# Patient Record
Sex: Female | Born: 1980 | Race: White | Hispanic: No | Marital: Married | Smoking: Never smoker
Health system: Southern US, Community
[De-identification: ages and names within clinical notes are randomized; demographics above are authoritative.]

## PROBLEM LIST (undated history)

## (undated) ENCOUNTER — Inpatient Hospital Stay (HOSPITAL_COMMUNITY): Payer: Self-pay

## (undated) DIAGNOSIS — F419 Anxiety disorder, unspecified: Secondary | ICD-10-CM

## (undated) DIAGNOSIS — N200 Calculus of kidney: Secondary | ICD-10-CM

## (undated) HISTORY — PX: FACIAL LACERATIONS REPAIR: SHX1571

## (undated) HISTORY — DX: Anxiety disorder, unspecified: F41.9

## (undated) HISTORY — PX: INGUINAL HERNIA REPAIR: SUR1180

---

## 2013-06-02 ENCOUNTER — Emergency Department (HOSPITAL_COMMUNITY)
Admission: EM | Admit: 2013-06-02 | Discharge: 2013-06-03 | Disposition: A | Discharge status: Home / Self Care | Payer: BC Managed Care – PPO | Attending: Emergency Medicine | Admitting: Emergency Medicine

## 2013-06-02 DIAGNOSIS — Z3202 Encounter for pregnancy test, result negative: Secondary | ICD-10-CM | POA: Insufficient documentation

## 2013-06-02 DIAGNOSIS — R300 Dysuria: Secondary | ICD-10-CM | POA: Insufficient documentation

## 2013-06-02 DIAGNOSIS — N200 Calculus of kidney: Secondary | ICD-10-CM | POA: Insufficient documentation

## 2013-06-02 HISTORY — DX: Calculus of kidney: N20.0

## 2013-06-02 NOTE — ED Notes (Signed)
MD at bedside. 

## 2013-06-02 NOTE — ED Notes (Signed)
Pt. Came in with complaint of right flank pain at 10/10. Pt. Has history of kidney stone a year ago. W/ N/V.

## 2013-06-03 ENCOUNTER — Encounter (HOSPITAL_COMMUNITY): Payer: Self-pay | Admitting: Radiology

## 2013-06-03 ENCOUNTER — Emergency Department (HOSPITAL_COMMUNITY): Payer: BC Managed Care – PPO

## 2013-06-03 LAB — POCT I-STAT, CHEM 8
BUN: 13 mg/dL (ref 6–23)
Calcium, Ion: 1.16 mmol/L (ref 1.12–1.23)
Creatinine, Ser: 0.9 mg/dL (ref 0.50–1.10)
Sodium: 143 mEq/L (ref 135–145)
TCO2: 23 mmol/L (ref 0–100)

## 2013-06-03 LAB — URINE MICROSCOPIC-ADD ON

## 2013-06-03 LAB — URINALYSIS, ROUTINE W REFLEX MICROSCOPIC
Glucose, UA: NEGATIVE mg/dL
Ketones, ur: NEGATIVE mg/dL
Protein, ur: NEGATIVE mg/dL

## 2013-06-03 LAB — CBC WITH DIFFERENTIAL/PLATELET
Basophils Relative: 1 % (ref 0–1)
Eosinophils Absolute: 0.1 10*3/uL (ref 0.0–0.7)
HCT: 36.8 % (ref 36.0–46.0)
Hemoglobin: 13 g/dL (ref 12.0–15.0)
MCH: 31.9 pg (ref 26.0–34.0)
MCHC: 35.3 g/dL (ref 30.0–36.0)
Monocytes Absolute: 0.6 10*3/uL (ref 0.1–1.0)
Monocytes Relative: 8 % (ref 3–12)

## 2013-06-03 MED ORDER — HYDROCODONE-ACETAMINOPHEN 5-325 MG PO TABS: 1.0000 | ORAL_TABLET | Freq: Four times a day (QID) | ORAL | Status: DC | PRN

## 2013-06-03 MED ORDER — ONDANSETRON HCL 4 MG PO TABS: 4.0000 mg | ORAL_TABLET | Freq: Four times a day (QID) | ORAL | Status: DC

## 2013-06-03 MED ORDER — SODIUM CHLORIDE 0.9 % IV BOLUS (SEPSIS)
1000.0000 mL | Freq: Once | INTRAVENOUS | Status: AC
  Administered 2013-06-03: 1000 mL via INTRAVENOUS

## 2013-06-03 MED ORDER — ONDANSETRON HCL 4 MG/2ML IJ SOLN
4.0000 mg | Freq: Once | INTRAMUSCULAR | Status: AC
  Administered 2013-06-03: 4 mg via INTRAVENOUS
  Filled 2013-06-03: qty 2

## 2013-06-03 MED ORDER — MORPHINE SULFATE 4 MG/ML IJ SOLN
4.0000 mg | Freq: Once | INTRAMUSCULAR | Status: AC
  Administered 2013-06-03: 4 mg via INTRAVENOUS
  Filled 2013-06-03: qty 1

## 2013-06-03 NOTE — ED Notes (Signed)
Notified RN, Hardie Lora, pt. I-stat Chem 8 results potassium 3.4.

## 2013-06-03 NOTE — ED Provider Notes (Signed)
CSN: [id number]     Arrival date & time 06/02/13  2335 History   First MD Initiated Contact with Patient 06/02/13 2340     Chief Complaint  Patient presents with  . Flank Pain   (Consider location/radiation/quality/duration/timing/severity/associated sxs/prior Treatment) HPI Comments: Pain is in right flank, sudden onset, sharp, intermittent.  +associated n/v, dysuria.  Hx of similar symptoms w/ kidney stone in past.  Pain radiates to RLQ.  Patient is a 32 y.o. female presenting with flank pain. The history is provided by the patient. No language interpreter was used.  Flank Pain This is a new problem. The current episode started less than 1 hour ago. The problem occurs constantly. The problem has not changed since onset.Associated symptoms include abdominal pain. Pertinent negatives include no chest pain, no headaches and no shortness of breath. Nothing aggravates the symptoms. Nothing relieves the symptoms. Treatments tried: hydorcodone, zofran. The treatment provided mild relief.    Past Medical History  Diagnosis Date  . Kidney stones    No past surgical history on file. No family history on file. History  Substance Use Topics  . Smoking status: Not on file  . Smokeless tobacco: Not on file  . Alcohol Use: Not on file   OB History   Grav Para Term Preterm Abortions TAB SAB Ect Mult Living                 Review of Systems  Constitutional: Negative for fever, chills, diaphoresis, activity change, appetite change and fatigue.  HENT: Negative for congestion, sore throat, facial swelling, rhinorrhea, neck pain and neck stiffness.   Eyes: Negative for photophobia and discharge.  Respiratory: Negative for cough, chest tightness and shortness of breath.   Cardiovascular: Negative for chest pain, palpitations and leg swelling.  Gastrointestinal: Positive for abdominal pain. Negative for nausea, vomiting and diarrhea.  Endocrine: Negative for polydipsia and polyuria.  Genitourinary:  Positive for dysuria and flank pain. Negative for frequency, difficulty urinating and pelvic pain.  Musculoskeletal: Negative for back pain and arthralgias.  Skin: Negative for color change and wound.  Allergic/Immunologic: Negative for immunocompromised state.  Neurological: Negative for facial asymmetry, weakness, numbness and headaches.  Hematological: Does not bruise/bleed easily.  Psychiatric/Behavioral: Negative for confusion and agitation.    Allergies  Bactrim  Home Medications   Current Outpatient Rx  Name  Route  Sig  Dispense  Refill  . HYDROcodone-acetaminophen (NORCO/VICODIN) 5-325 MG per tablet   Oral   Take 1 tablet by mouth every 6 (six) hours as needed for pain.         Marland Kitchen ondansetron (ZOFRAN) 4 MG tablet   Oral   Take 4 mg by mouth every 8 (eight) hours as needed for nausea.         Marland Kitchen HYDROcodone-acetaminophen (NORCO/VICODIN) 5-325 MG per tablet   Oral   Take 1 tablet by mouth every 6 (six) hours as needed for pain.   15 tablet   0   . ondansetron (ZOFRAN) 4 MG tablet   Oral   Take 1 tablet (4 mg total) by mouth every 6 (six) hours.   15 tablet   0    BP 101/57  Pulse 54  Temp(Src) 98 F (36.7 C) (Oral)  Resp 18  Ht 5\' 4"  (1.626 m)  Wt 135 lb (61.236 kg)  BMI 23.16 kg/m2  SpO2 96%  LMP 05/16/2013 Physical Exam  Constitutional: She is oriented to person, place, and time. She appears well-developed and well-nourished. No distress.  HENT:  Head: Normocephalic and atraumatic.  Mouth/Throat: No oropharyngeal exudate.  Eyes: Pupils are equal, round, and reactive to light.  Neck: Normal range of motion. Neck supple.  Cardiovascular: Normal rate, regular rhythm and normal heart sounds.  Exam reveals no gallop and no friction rub.   No murmur heard. Pulmonary/Chest: Effort normal and breath sounds normal. No respiratory distress. She has no wheezes. She has no rales.  Abdominal: Soft. Bowel sounds are normal. She exhibits no distension and no mass.  There is no tenderness. There is no rebound and no guarding.  R flank pain w/o CVA tenderness, mild RLQ tenderness  Musculoskeletal: Normal range of motion. She exhibits no edema and no tenderness.  Neurological: She is alert and oriented to person, place, and time.  Skin: Skin is warm and dry.  Psychiatric: She has a normal mood and affect.    ED Course  Procedures (including critical care time) Labs Review Labs Reviewed  CBC WITH DIFFERENTIAL - Abnormal; Notable for the following:    Lymphocytes Relative 47 (*)    All other components within normal limits  URINALYSIS, ROUTINE W REFLEX MICROSCOPIC - Abnormal; Notable for the following:    APPearance CLOUDY (*)    Hgb urine dipstick LARGE (*)    All other components within normal limits  POCT I-STAT, CHEM 8 - Abnormal; Notable for the following:    Potassium 3.4 (*)    Glucose, Bld 114 (*)    All other components within normal limits  PREGNANCY, URINE  URINE MICROSCOPIC-ADD ON   Imaging Review Ct Abdomen Pelvis Wo Contrast  06/03/2013   *RADIOLOGY REPORT*  Clinical Data: Right flank pain radiating to right lower quadrant since 2300, nausea, vomiting, history bilateral kidney stones  CT ABDOMEN AND PELVIS WITHOUT CONTRAST  Technique:  Multidetector CT imaging of the abdomen and pelvis was performed following the standard protocol without intravenous contrast. Sagittal and coronal MPR images reconstructed from axial data set.  Comparison: None  Findings: Dependent atelectasis at bilateral lung bases. Tiny bilateral nonobstructing renal calculi. No hydronephrosis, ureteral calcification or ureteral dilatation. Within limits of a nonenhanced exam no focal abnormalities of the liver, spleen, pancreas, kidneys, or adrenal glands otherwise identified.  Normal appendix. Unremarkable bladder, uterus and adnexae. Tiny umbilical hernia containing fat. Stomach and bowel loops unremarkable. No mass, adenopathy, free fluid, or inflammatory process.  Bones unremarkable.  IMPRESSION: Tiny bilateral nonobstructing renal calculi. No acute intra-abdominal or intrapelvic abnormalities identified.   Original Report Authenticated By: Ulyses Southward, M.D.    MDM   1. Nephrolithiasis    Pt is a 32 y.o. female with Pmhx as above who presents with sudden onset R flank pain w/ radiation to the RLQ beginning about 30 mins PTA, assoc nausean vom/x, dysuria.  Hx of similar symptoms w/ kidney stone in past.  On PE, pt appears uncomfortable, but is NAD.  UA has large blood, but urine not infected. CBC, BMP, unremarkable.  CT stone study shows tiny BL nonobstructive renal calculi.  Pt feeling improved after 1 dose of IV dilaudid and IVF.  I believe she has likely passed a stone given her urine and improvement of symptoms.  I doubt acute surgical cause of flank pain such as appendicitis or ovarian torsion. Will d/c home w/ Rx for norco & zofran.  Return precautions given for new or worsening symptoms including worsening pain, fever, inability to tolerate PO.   1. Nephrolithiasis         Shanna Cisco, MD 06/03/13  0647 

## 2014-04-04 LAB — OB RESULTS CONSOLE GBS: STREP GROUP B AG: POSITIVE

## 2014-04-05 LAB — OB RESULTS CONSOLE ABO/RH: RH Type: POSITIVE

## 2014-04-05 LAB — OB RESULTS CONSOLE RUBELLA ANTIBODY, IGM: RUBELLA: IMMUNE

## 2014-04-05 LAB — OB RESULTS CONSOLE GC/CHLAMYDIA
CHLAMYDIA, DNA PROBE: NEGATIVE
GC PROBE AMP, GENITAL: NEGATIVE

## 2014-04-05 LAB — OB RESULTS CONSOLE HEPATITIS B SURFACE ANTIGEN: Hepatitis B Surface Ag: NEGATIVE

## 2014-04-05 LAB — OB RESULTS CONSOLE HIV ANTIBODY (ROUTINE TESTING): HIV: NONREACTIVE

## 2014-04-05 LAB — OB RESULTS CONSOLE ANTIBODY SCREEN: ANTIBODY SCREEN: NEGATIVE

## 2014-04-05 LAB — OB RESULTS CONSOLE RPR: RPR: NONREACTIVE

## 2014-09-28 NOTE — L&D Delivery Note (Signed)
Delivery Note  SVD viable female Apgars 8,9 over 2nd degree ML lac.  Delivered through nuchal cord reduced over body due to tight cord..  Placenta delivered spontaneously intact with 3VC. Repair with 2-0 Chromic with good support and hemostasis noted and R/V exam confirms.  PH art was sent.  [place] cord blood was not done..  Mother and baby were doing well.  EBL 300cc  Candice Campavid Kaine Mcquillen, MD

## 2014-10-13 ENCOUNTER — Encounter (HOSPITAL_COMMUNITY): Payer: Self-pay | Admitting: *Deleted

## 2014-10-13 ENCOUNTER — Inpatient Hospital Stay (HOSPITAL_COMMUNITY)
Admission: AD | Admit: 2014-10-13 | Discharge: 2014-10-13 | Disposition: A | Discharge status: Home / Self Care | Payer: 59 | Source: Ambulatory Visit | Attending: Obstetrics and Gynecology | Admitting: Obstetrics and Gynecology

## 2014-10-13 DIAGNOSIS — Z3A35 35 weeks gestation of pregnancy: Secondary | ICD-10-CM | POA: Diagnosis not present

## 2014-10-13 DIAGNOSIS — N898 Other specified noninflammatory disorders of vagina: Secondary | ICD-10-CM | POA: Diagnosis present

## 2014-10-13 DIAGNOSIS — O26893 Other specified pregnancy related conditions, third trimester: Secondary | ICD-10-CM | POA: Insufficient documentation

## 2014-10-13 LAB — URINALYSIS, ROUTINE W REFLEX MICROSCOPIC
BILIRUBIN URINE: NEGATIVE
Glucose, UA: NEGATIVE mg/dL
KETONES UR: NEGATIVE mg/dL
NITRITE: NEGATIVE
PH: 6 (ref 5.0–8.0)
Protein, ur: NEGATIVE mg/dL
Specific Gravity, Urine: 1.025 (ref 1.005–1.030)
UROBILINOGEN UA: 0.2 mg/dL (ref 0.0–1.0)

## 2014-10-13 LAB — AMNISURE RUPTURE OF MEMBRANE (ROM) NOT AT ARMC: Amnisure ROM: NEGATIVE

## 2014-10-13 LAB — URINE MICROSCOPIC-ADD ON

## 2014-10-13 LAB — POCT FERN TEST: POCT Fern Test: NEGATIVE

## 2014-10-13 NOTE — MAU Provider Note (Signed)
Subjective:  Ms. Samuel BoucheKacy Alberto is a 34 y.o. female G2P1002 at 926w6d who presents with complaints of leaking clear, vaginal discharge since this morning. She has continued to leak fluid throughout the morning. This has been an uncomplicated pregnancy and she denies history of preterm delivery.   She denies vaginal bleeding + fetal movements  Objective:  GENERAL: Well-developed, well-nourished female in no acute distress.  HEENT: Normocephalic, atraumatic.   SKIN: Warm, dry and without erythema PSYCH: Normal mood and affect  Filed Vitals:   10/13/14 1002 10/13/14 1017  BP:  111/63  Pulse:  75  Temp:  98.4 F (36.9 C)  TempSrc:  Oral  Resp:  18  Height: 5\' 4"  (1.626 m)   Weight: 68.947 kg (152 lb)    Results for orders placed or performed during the hospital encounter of 10/13/14 (from the past 48 hour(s))  Urinalysis, Routine w reflex microscopic     Status: Abnormal   Collection Time: 10/13/14  9:50 AM  Result Value Ref Range   Color, Urine YELLOW YELLOW   APPearance CLEAR CLEAR   Specific Gravity, Urine 1.025 1.005 - 1.030   pH 6.0 5.0 - 8.0   Glucose, UA NEGATIVE NEGATIVE mg/dL   Hgb urine dipstick TRACE (A) NEGATIVE   Bilirubin Urine NEGATIVE NEGATIVE   Ketones, ur NEGATIVE NEGATIVE mg/dL   Protein, ur NEGATIVE NEGATIVE mg/dL   Urobilinogen, UA 0.2 0.0 - 1.0 mg/dL   Nitrite NEGATIVE NEGATIVE   Leukocytes, UA TRACE (A) NEGATIVE  Urine microscopic-add on     Status: Abnormal   Collection Time: 10/13/14  9:50 AM  Result Value Ref Range   Squamous Epithelial / LPF FEW (A) RARE   WBC, UA 3-6 <3 WBC/hpf   RBC / HPF 3-6 <3 RBC/hpf   Bacteria, UA MANY (A) RARE  Amnisure rupture of membrane (rom)     Status: None   Collection Time: 10/13/14 10:40 AM  Result Value Ref Range   Amnisure ROM NEGATIVE   Fern Test     Status: Normal   Collection Time: 10/13/14 11:26 AM  Result Value Ref Range   POCT Fern Test Negative = intact amniotic membranes     Speculum exam: Vagina  - Small amount of creamy discharge, no odor, no pooling of fluid in the vaginal canal  Cervix - No contact bleeding, large amount of thick mucus adhered to cervix Bimanual exam: Dilation: 1 Effacement (%): Thick Exam by:: J Huxley Shurley NP  Crist FatFern collected, amnisure collected  Chaperone present for exam.   MDM Fern slide negative  Amnisure  Urine culture pending; reviewed UA after the patient left the hospital. Urine culture placed   Assessment:  Vaginal discharge in pregnancy; likely leukorrhea   Plan:  RN to call and notify Dr. Adela PortsAdkins    Safia Panzer Irene Sariyah Corcino, NP 10/13/2014 10:50 AM

## 2014-10-13 NOTE — Discharge Instructions (Signed)

## 2014-10-13 NOTE — Progress Notes (Signed)
Dr Renaldo FiddlerAdkins notified of pt's complaints, VE, FHR pattern, fern and amnisure results, orders received to discharge

## 2014-10-13 NOTE — MAU Note (Signed)
Pt states at 0745 had small amount of fluid/discharge. Unsure if water broke. Still having small amount of discharge. No large gush of fluid.

## 2014-10-14 LAB — URINE CULTURE
Colony Count: 10000
Special Requests: NORMAL

## 2014-11-02 ENCOUNTER — Telehealth (HOSPITAL_COMMUNITY): Payer: Self-pay | Admitting: *Deleted

## 2014-11-02 ENCOUNTER — Encounter (HOSPITAL_COMMUNITY): Payer: Self-pay | Admitting: *Deleted

## 2014-11-02 LAB — OB RESULTS CONSOLE GBS: STREP GROUP B AG: POSITIVE

## 2014-11-02 NOTE — Telephone Encounter (Signed)
Preadmission screen  

## 2014-11-05 ENCOUNTER — Encounter (HOSPITAL_COMMUNITY): Payer: Self-pay

## 2014-11-05 ENCOUNTER — Inpatient Hospital Stay (HOSPITAL_COMMUNITY)
Admission: RE | Admit: 2014-11-05 | Discharge: 2014-11-07 | DRG: 774 | Disposition: A | Discharge status: Home / Self Care | Payer: 59 | Source: Ambulatory Visit | Attending: Obstetrics and Gynecology | Admitting: Obstetrics and Gynecology

## 2014-11-05 DIAGNOSIS — O09523 Supervision of elderly multigravida, third trimester: Secondary | ICD-10-CM | POA: Diagnosis not present

## 2014-11-05 DIAGNOSIS — O9089 Other complications of the puerperium, not elsewhere classified: Secondary | ICD-10-CM | POA: Diagnosis present

## 2014-11-05 DIAGNOSIS — Z3490 Encounter for supervision of normal pregnancy, unspecified, unspecified trimester: Secondary | ICD-10-CM

## 2014-11-05 DIAGNOSIS — R3919 Other difficulties with micturition: Secondary | ICD-10-CM | POA: Diagnosis present

## 2014-11-05 DIAGNOSIS — O99824 Streptococcus B carrier state complicating childbirth: Secondary | ICD-10-CM | POA: Diagnosis present

## 2014-11-05 DIAGNOSIS — Z3A39 39 weeks gestation of pregnancy: Secondary | ICD-10-CM | POA: Diagnosis present

## 2014-11-05 DIAGNOSIS — Z3483 Encounter for supervision of other normal pregnancy, third trimester: Secondary | ICD-10-CM | POA: Diagnosis present

## 2014-11-05 LAB — CBC
HEMATOCRIT: 32.4 % — AB (ref 36.0–46.0)
HEMOGLOBIN: 11.1 g/dL — AB (ref 12.0–15.0)
MCH: 31.1 pg (ref 26.0–34.0)
MCHC: 34.3 g/dL (ref 30.0–36.0)
MCV: 90.8 fL (ref 78.0–100.0)
Platelets: 186 10*3/uL (ref 150–400)
RBC: 3.57 MIL/uL — ABNORMAL LOW (ref 3.87–5.11)
RDW: 14.3 % (ref 11.5–15.5)
WBC: 7.1 10*3/uL (ref 4.0–10.5)

## 2014-11-05 MED ORDER — LIDOCAINE HCL (PF) 1 % IJ SOLN
30.0000 mL | INTRAMUSCULAR | Status: DC | PRN
  Filled 2014-11-05: qty 30

## 2014-11-05 MED ORDER — ONDANSETRON HCL 4 MG/2ML IJ SOLN: 4.0000 mg | Freq: Four times a day (QID) | INTRAMUSCULAR | Status: DC | PRN

## 2014-11-05 MED ORDER — MISOPROSTOL 25 MCG QUARTER TABLET
25.0000 ug | ORAL_TABLET | ORAL | Status: DC | PRN
  Administered 2014-11-05: 25 ug via VAGINAL
  Filled 2014-11-05: qty 0.25
  Filled 2014-11-05: qty 1

## 2014-11-05 MED ORDER — PENICILLIN G POTASSIUM 5000000 UNITS IJ SOLR
2.5000 10*6.[IU] | INTRAVENOUS | Status: DC
  Filled 2014-11-05 (×2): qty 2.5

## 2014-11-05 MED ORDER — ACETAMINOPHEN 325 MG PO TABS: 650.0000 mg | ORAL_TABLET | ORAL | Status: DC | PRN

## 2014-11-05 MED ORDER — OXYTOCIN 40 UNITS IN LACTATED RINGERS INFUSION - SIMPLE MED
1.0000 m[IU]/min | INTRAVENOUS | Status: DC
  Administered 2014-11-06: 2 m[IU]/min via INTRAVENOUS
  Filled 2014-11-05: qty 1000

## 2014-11-05 MED ORDER — LACTATED RINGERS IV SOLN
INTRAVENOUS | Status: DC
  Administered 2014-11-05 – 2014-11-06 (×5): via INTRAVENOUS

## 2014-11-05 MED ORDER — LACTATED RINGERS IV SOLN: 500.0000 mL | INTRAVENOUS | Status: DC | PRN

## 2014-11-05 MED ORDER — OXYCODONE-ACETAMINOPHEN 5-325 MG PO TABS
2.0000 | ORAL_TABLET | ORAL | Status: DC | PRN
Start: 2014-11-05 — End: 2014-11-06

## 2014-11-05 MED ORDER — ZOLPIDEM TARTRATE 5 MG PO TABS
5.0000 mg | ORAL_TABLET | Freq: Every evening | ORAL | Status: DC | PRN
  Administered 2014-11-05: 5 mg via ORAL
  Filled 2014-11-05: qty 1

## 2014-11-05 MED ORDER — OXYTOCIN BOLUS FROM INFUSION: 500.0000 mL | INTRAVENOUS | Status: DC

## 2014-11-05 MED ORDER — TERBUTALINE SULFATE 1 MG/ML IJ SOLN: 0.2500 mg | Freq: Once | INTRAMUSCULAR | Status: AC | PRN

## 2014-11-05 MED ORDER — CITRIC ACID-SODIUM CITRATE 334-500 MG/5ML PO SOLN: 30.0000 mL | ORAL | Status: DC | PRN

## 2014-11-05 MED ORDER — OXYCODONE-ACETAMINOPHEN 5-325 MG PO TABS: 1.0000 | ORAL_TABLET | ORAL | Status: DC | PRN

## 2014-11-05 MED ORDER — PENICILLIN G POTASSIUM 5000000 UNITS IJ SOLR
5.0000 10*6.[IU] | Freq: Once | INTRAVENOUS | Status: DC
  Filled 2014-11-05: qty 5

## 2014-11-05 MED ORDER — OXYTOCIN 40 UNITS IN LACTATED RINGERS INFUSION - SIMPLE MED: 62.5000 mL/h | INTRAVENOUS | Status: DC

## 2014-11-06 ENCOUNTER — Encounter (HOSPITAL_COMMUNITY): Payer: Self-pay

## 2014-11-06 ENCOUNTER — Inpatient Hospital Stay (HOSPITAL_COMMUNITY): Payer: 59 | Admitting: Anesthesiology

## 2014-11-06 LAB — TYPE AND SCREEN
ABO/RH(D): B POS
Antibody Screen: NEGATIVE

## 2014-11-06 LAB — ABO/RH: ABO/RH(D): B POS

## 2014-11-06 MED ORDER — ZOLPIDEM TARTRATE 5 MG PO TABS
5.0000 mg | ORAL_TABLET | Freq: Every evening | ORAL | Status: DC | PRN
Start: 2014-11-06 — End: 2014-11-08

## 2014-11-06 MED ORDER — OXYCODONE-ACETAMINOPHEN 5-325 MG PO TABS: 1.0000 | ORAL_TABLET | ORAL | Status: DC | PRN

## 2014-11-06 MED ORDER — PHENYLEPHRINE 40 MCG/ML (10ML) SYRINGE FOR IV PUSH (FOR BLOOD PRESSURE SUPPORT)
80.0000 ug | PREFILLED_SYRINGE | INTRAVENOUS | Status: AC | PRN
  Administered 2014-11-06 (×3): 80 ug via INTRAVENOUS
  Filled 2014-11-06: qty 20

## 2014-11-06 MED ORDER — PRENATAL MULTIVITAMIN CH
1.0000 | ORAL_TABLET | Freq: Every day | ORAL | Status: DC
  Filled 2014-11-06: qty 1

## 2014-11-06 MED ORDER — IBUPROFEN 600 MG PO TABS
600.0000 mg | ORAL_TABLET | Freq: Four times a day (QID) | ORAL | Status: DC
  Administered 2014-11-06 – 2014-11-07 (×4): 600 mg via ORAL
  Filled 2014-11-06 (×4): qty 1

## 2014-11-06 MED ORDER — MEDROXYPROGESTERONE ACETATE 150 MG/ML IM SUSP: 150.0000 mg | INTRAMUSCULAR | Status: DC | PRN

## 2014-11-06 MED ORDER — ONDANSETRON HCL 4 MG/2ML IJ SOLN: 4.0000 mg | INTRAMUSCULAR | Status: DC | PRN

## 2014-11-06 MED ORDER — EPHEDRINE 5 MG/ML INJ
10.0000 mg | INTRAVENOUS | Status: DC | PRN
  Administered 2014-11-06: 10 mg via INTRAVENOUS
  Filled 2014-11-06: qty 2

## 2014-11-06 MED ORDER — TETANUS-DIPHTH-ACELL PERTUSSIS 5-2.5-18.5 LF-MCG/0.5 IM SUSP: 0.5000 mL | Freq: Once | INTRAMUSCULAR | Status: DC

## 2014-11-06 MED ORDER — BUTORPHANOL TARTRATE 1 MG/ML IJ SOLN
1.0000 mg | INTRAMUSCULAR | Status: DC | PRN
  Administered 2014-11-06: 1 mg via INTRAVENOUS
  Filled 2014-11-06: qty 1

## 2014-11-06 MED ORDER — LANOLIN HYDROUS EX OINT: TOPICAL_OINTMENT | CUTANEOUS | Status: DC | PRN

## 2014-11-06 MED ORDER — WITCH HAZEL-GLYCERIN EX PADS: 1.0000 "application " | MEDICATED_PAD | CUTANEOUS | Status: DC | PRN

## 2014-11-06 MED ORDER — SIMETHICONE 80 MG PO CHEW: 80.0000 mg | CHEWABLE_TABLET | ORAL | Status: DC | PRN

## 2014-11-06 MED ORDER — SODIUM CHLORIDE 0.9 % IV SOLN
2.0000 g | Freq: Four times a day (QID) | INTRAVENOUS | Status: DC
  Administered 2014-11-06 (×2): 2 g via INTRAVENOUS
  Filled 2014-11-06 (×4): qty 2000

## 2014-11-06 MED ORDER — DIPHENHYDRAMINE HCL 25 MG PO CAPS: 25.0000 mg | ORAL_CAPSULE | Freq: Four times a day (QID) | ORAL | Status: DC | PRN

## 2014-11-06 MED ORDER — LIDOCAINE HCL (PF) 1 % IJ SOLN
INTRAMUSCULAR | Status: DC | PRN
  Administered 2014-11-06 (×2): 5 mL

## 2014-11-06 MED ORDER — OXYCODONE-ACETAMINOPHEN 5-325 MG PO TABS: 2.0000 | ORAL_TABLET | ORAL | Status: DC | PRN

## 2014-11-06 MED ORDER — DIPHENHYDRAMINE HCL 50 MG/ML IJ SOLN: 12.5000 mg | INTRAMUSCULAR | Status: DC | PRN

## 2014-11-06 MED ORDER — BENZOCAINE-MENTHOL 20-0.5 % EX AERO
1.0000 "application " | INHALATION_SPRAY | CUTANEOUS | Status: DC | PRN
  Filled 2014-11-06: qty 56

## 2014-11-06 MED ORDER — SENNOSIDES-DOCUSATE SODIUM 8.6-50 MG PO TABS
2.0000 | ORAL_TABLET | ORAL | Status: DC
  Administered 2014-11-06: 2 via ORAL
  Filled 2014-11-06: qty 2

## 2014-11-06 MED ORDER — LACTATED RINGERS IV SOLN: 500.0000 mL | Freq: Once | INTRAVENOUS | Status: DC

## 2014-11-06 MED ORDER — PHENYLEPHRINE 40 MCG/ML (10ML) SYRINGE FOR IV PUSH (FOR BLOOD PRESSURE SUPPORT)
80.0000 ug | PREFILLED_SYRINGE | INTRAVENOUS | Status: DC | PRN
  Filled 2014-11-06: qty 2

## 2014-11-06 MED ORDER — FENTANYL 2.5 MCG/ML BUPIVACAINE 1/10 % EPIDURAL INFUSION (WH - ANES)
14.0000 mL/h | INTRAMUSCULAR | Status: DC | PRN
  Administered 2014-11-06: 14 mL/h via EPIDURAL
  Filled 2014-11-06: qty 125

## 2014-11-06 MED ORDER — MEASLES, MUMPS & RUBELLA VAC ~~LOC~~ INJ: 0.5000 mL | INJECTION | Freq: Once | SUBCUTANEOUS | Status: DC

## 2014-11-06 MED ORDER — EPHEDRINE 5 MG/ML INJ
10.0000 mg | INTRAVENOUS | Status: DC | PRN
  Filled 2014-11-06: qty 4
  Filled 2014-11-06: qty 2

## 2014-11-06 MED ORDER — DIBUCAINE 1 % RE OINT: 1.0000 "application " | TOPICAL_OINTMENT | RECTAL | Status: DC | PRN

## 2014-11-06 MED ORDER — ONDANSETRON HCL 4 MG PO TABS: 4.0000 mg | ORAL_TABLET | ORAL | Status: DC | PRN

## 2014-11-06 NOTE — Progress Notes (Addendum)
Plan for IV pain relief. Pt is contracting q 2-3 minutes, cytotec was placed 4.5 hours ago. Per Dr Renaldo FiddlerAdkins will reevaluate in the AM to see if another dose of cytotec is needed or initiation of pitocin if ctx decrease.

## 2014-11-06 NOTE — Anesthesia Preprocedure Evaluation (Signed)
Anesthesia Evaluation  Patient identified by MRN, date of birth, ID band Patient awake    Reviewed: Allergy & Precautions, H&P , Patient's Chart, lab work & pertinent test results  Airway Mallampati: II TM Distance: >3 FB Neck ROM: full    Dental   Pulmonary  breath sounds clear to auscultation        Cardiovascular Rhythm:regular Rate:Normal     Neuro/Psych PSYCHIATRIC DISORDERS    GI/Hepatic   Endo/Other    Renal/GU      Musculoskeletal   Abdominal   Peds  Hematology   Anesthesia Other Findings   Reproductive/Obstetrics (+) Pregnancy                           Anesthesia Physical Anesthesia Plan  ASA: II  Anesthesia Plan: Epidural   Post-op Pain Management:    Induction:   Airway Management Planned:   Additional Equipment:   Intra-op Plan:   Post-operative Plan:   Informed Consent: I have reviewed the patients History and Physical, chart, labs and discussed the procedure including the risks, benefits and alternatives for the proposed anesthesia with the patient or authorized representative who has indicated his/her understanding and acceptance.     Plan Discussed with:   Anesthesia Plan Comments:         Anesthesia Quick Evaluation  

## 2014-11-06 NOTE — H&P (Signed)
Pamela Gamble is a 34 y.o. female presenting for induction of labor.  Pregnancy complicated by AMA with normal NIPT and GBS+. History OB History    Gravida Para Term Preterm AB TAB SAB Ectopic Multiple Living   2 1 1  0 0 0 0 0 0 1     Past Medical History  Diagnosis Date  . Kidney stones   . Anxiety    Past Surgical History  Procedure Laterality Date  . Inguinal hernia repair    . Facial lacerations repair     Family History: family history includes Cancer in her father and paternal uncle; Hypertension in her mother and paternal grandmother. Social History:  reports that she has never smoked. She has never used smokeless tobacco. She reports that she does not drink alcohol or use illicit drugs.   Prenatal Transfer Tool  Maternal Diabetes: No Genetic Screening: Normal Maternal Ultrasounds/Referrals: Normal Fetal Ultrasounds or other Referrals:  None Maternal Substance Abuse:  No Significant Maternal Medications:  None Significant Maternal Lab Results:  None Other Comments:  None  ROS  Dilation: 2 Effacement (%): 50 Station: -2 Exam by:: Sharmin Foulk Blood pressure 105/55, pulse 77, temperature 97.9 F (36.6 C), temperature source Oral, resp. rate 20, height 5\' 4"  (1.626 m), weight 154 lb (69.854 kg), SpO2 100 %. Exam Physical Exam  Prenatal labs: ABO, Rh: --/--/B POS, B POS (02/08 2030) Antibody: NEG (02/08 2030) Rubella: Immune (07/09 0000) RPR: Nonreactive (07/09 0000)  HBsAg: Negative (07/09 0000)  HIV: Non-reactive (07/09 0000)  GBS: Positive (02/05 0000)   Assessment/Plan: IUP at term GBS + antibiotics throughout labor AROM/Pitocin  Anticipate SVD  Rashana Andrew C 11/06/2014, 9:32 AM

## 2014-11-06 NOTE — Plan of Care (Signed)
Spoke with Dr. Malen GauzeFoster informed him pt had received 3rd dose of phenylephrine. States to give pt ephedrine 10mg  ivp.

## 2014-11-06 NOTE — Anesthesia Procedure Notes (Signed)
Epidural Patient location during procedure: OB Start time: 11/06/2014 8:20 AM  Staffing Anesthesiologist: Brayton CavesJACKSON, Chrishaun Sasso Performed by: anesthesiologist   Preanesthetic Checklist Completed: patient identified, site marked, surgical consent, pre-op evaluation, timeout performed, IV checked, risks and benefits discussed and monitors and equipment checked  Epidural Patient position: sitting Prep: site prepped and draped and DuraPrep Patient monitoring: continuous pulse ox and blood pressure Approach: midline Location: L3-L4 Injection technique: LOR air  Needle:  Needle type: Tuohy  Needle gauge: 17 G Needle length: 9 cm and 9 Needle insertion depth: 5 cm cm Catheter type: closed end flexible Catheter size: 19 Gauge Catheter at skin depth: 10 cm Test dose: negative  Assessment Events: blood not aspirated, injection not painful, no injection resistance, negative IV test and no paresthesia  Additional Notes Patient identified.  Risk benefits discussed including failed block, incomplete pain control, headache, nerve damage, paralysis, blood pressure changes, nausea, vomiting, reactions to medication both toxic or allergic, and postpartum back pain.  Patient expressed understanding and wished to proceed.  All questions were answered.  Sterile technique used throughout procedure and epidural site dressed with sterile barrier dressing. No paresthesia or other complications noted.The patient did not experience any signs of intravascular injection such as tinnitus or metallic taste in mouth nor signs of intrathecal spread such as rapid motor block. Please see nursing notes for vital signs.

## 2014-11-07 LAB — CBC
HCT: 31.5 % — ABNORMAL LOW (ref 36.0–46.0)
Hemoglobin: 10.8 g/dL — ABNORMAL LOW (ref 12.0–15.0)
MCH: 31.4 pg (ref 26.0–34.0)
MCHC: 34.3 g/dL (ref 30.0–36.0)
MCV: 91.6 fL (ref 78.0–100.0)
PLATELETS: 177 10*3/uL (ref 150–400)
RBC: 3.44 MIL/uL — ABNORMAL LOW (ref 3.87–5.11)
RDW: 14.7 % (ref 11.5–15.5)
WBC: 8.8 10*3/uL (ref 4.0–10.5)

## 2014-11-07 LAB — HIV ANTIBODY (ROUTINE TESTING W REFLEX): HIV SCREEN 4TH GENERATION: NONREACTIVE

## 2014-11-07 LAB — RPR: RPR: NONREACTIVE

## 2014-11-07 MED ORDER — IBUPROFEN 600 MG PO TABS: 600.0000 mg | ORAL_TABLET | Freq: Four times a day (QID) | ORAL | Status: DC | PRN

## 2014-11-07 MED ORDER — BENZOCAINE-MENTHOL 20-0.5 % EX AERO: 1.0000 "application " | INHALATION_SPRAY | Freq: Three times a day (TID) | CUTANEOUS | Status: DC | PRN

## 2014-11-07 MED ORDER — COMPLETENATE 29-1 MG PO CHEW
1.0000 | CHEWABLE_TABLET | Freq: Every day | ORAL | Status: DC
  Administered 2014-11-07: 1 via ORAL
  Filled 2014-11-07 (×2): qty 1

## 2014-11-07 MED ORDER — OXYCODONE-ACETAMINOPHEN 5-325 MG PO TABS: 1.0000 | ORAL_TABLET | Freq: Four times a day (QID) | ORAL | Status: DC | PRN

## 2014-11-07 NOTE — Progress Notes (Signed)
Post void now about 60cc Feels ready to go home  VSS Afeb  D/C home Instructions given, double void

## 2014-11-07 NOTE — Progress Notes (Signed)
Post Partum Day 1 Subjective: up ad lib, tolerating PO and complains of urine leakage , unsure if bladder emptying well.  Objective: Blood pressure 99/58, pulse 76, temperature 97.6 F (36.4 C), temperature source Oral, resp. rate 18, height 5\' 4"  (1.626 m), weight 154 lb (69.854 kg), SpO2 98 %, unknown if currently breastfeeding.  Physical Exam:  General: alert and cooperative Lochia: appropriate Uterine Fundus: firm Incision: healing well DVT Evaluation: No evidence of DVT seen on physical exam. Negative Homan's sign. No cords or calf tenderness. No significant calf/ankle edema.   Recent Labs  11/05/14 2030 11/07/14 0540  HGB 11.1* 10.8*  HCT 32.4* 31.5*    Assessment/Plan: Plan for discharge tomorrow   ultrasound of bladder for residual urine assessment   LOS: 2 days   Pamela Gamble 11/07/2014, 8:11 AM

## 2014-11-07 NOTE — Lactation Note (Signed)
This note was copied from the chart of Girl Samuel BoucheKacy Kennard. Lactation Consultation Note  Patient Name: Girl Samuel BoucheKacy Lecomte [id number]'s Date: 11/07/2014 Reason for consult: Initial assessment  Baby 21 hours of life. Mom nursed first baby for 3.5 months and used a NS throughout that time because she had nipple pain whenever she tried to nurse without shield. Mom states that she just finished nursing on left breast using the NS and baby did fine. However, mom does has a compression stripe on right nipple from nursing earlier without the shield. Examined baby's mouth, and showed mom how the tip of baby's tongue is heart-shaped when baby extends tongue just past the gumline. Also demonstrated how baby not able to lift tongue to palate. Enc mom to discuss with baby's pediatrician if nipple pain continues, especially after milk comes in and she is attempting to latch without the shield. Discussed need for additional stimulation to breast since baby not directly on nipple. Mom return-demonstrated hand expression with colostrum visible. Mom states that she has seen colostrum in baby's mouth. Enc mom to offer lots of STS and nurse with cues.  [facility] given [facility], aware of OP/BFSG, community resources, and [facility] phone [facility] assistance after D/C. Answered mom's questions about pumping/nursing and returning to work.   Maternal Data Has patient been taught Hand Expression?: Yes Does the patient have breastfeeding experience prior to this delivery?: Yes  Feeding Feeding Type: Breast Fed Length of feed: 30 min  LATCH Score/Interventions Latch: Grasps breast easily, tongue down, lips flanged, rhythmical sucking.  Audible Swallowing: A few with stimulation  Type of Nipple: Everted at rest and after stimulation Intervention(s): Shells  Comfort (Breast/Nipple): Filling, red/small blisters or bruises, mild/mod discomfort  Problem noted: Mild/Moderate discomfort  Hold (Positioning): No assistance needed to correctly  position infant at breast. Intervention(s): Skin to skin  LATCH Score: 8  Lactation Tools Discussed/Used     Consult Status Consult Status: Follow-up Date: 11/08/14 Follow-up type: In-patient    Geralynn OchsWILLIARD, Cressie Betzler 11/07/2014, 2:03 PM

## 2014-11-07 NOTE — Discharge Summary (Signed)
Obstetric Discharge Summary Reason for Admission: onset of labor Prenatal Procedures: ultrasound Intrapartum Procedures: spontaneous vaginal delivery Postpartum Procedures: none Complications-Operative and Postpartum: none HEMOGLOBIN  Date Value Ref Range Status  11/07/2014 10.8* 12.0 - 15.0 g/dL Final   HCT  Date Value Ref Range Status  11/07/2014 31.5* 36.0 - 46.0 % Final    Physical Exam:  General: alert, cooperative and no distress Lochia: appropriate Uterine Fundus: firm Incision: healing well DVT Evaluation: No evidence of DVT seen on physical exam.  Discharge Diagnoses: Term Pregnancy-delivered  Discharge Information: Date: 11/07/2014 Activity: pelvic rest Diet: routine Medications: PNV, Ibuprofen and Percocet Condition: stable Instructions: refer to practice specific booklet Discharge to: home   Newborn Data: Live born female  Birth Weight: 7 lb 0.9 oz (3200 g) APGAR: 8, 9  Home with mother.  Cressida Milford II,Natlie Asfour E 11/07/2014, 5:28 PM

## 2014-11-07 NOTE — Anesthesia Postprocedure Evaluation (Signed)
  Anesthesia Post-op Note  Patient: Pamela BoucheKacy [place]  Procedure(s) Performed: * No procedures listed *  Patient Location: Mother/Baby  Anesthesia Type:Epidural  Level of Consciousness: awake, alert , oriented and patient cooperative  Airway and Oxygen Therapy: Patient Spontanous Breathing  Post-op Pain: mild  Post-op Assessment: Patient's Cardiovascular Status Stable, Respiratory Function Stable, No headache, No backache, No residual numbness and No residual motor weakness  Post-op Vital Signs: stable  Last Vitals:  Filed Vitals:   11/07/14 0800  BP: 107/57  Pulse: 70  Temp: 36.8 C  Resp: 18    Complications: No apparent anesthesia complications

## 2014-11-08 NOTE — Progress Notes (Signed)
CSW received consult for history of generalized anxiety.   CSW unable to meet with the MOB prior to discharge as she was an early discharge. CSW was not notified of any acute concerns.

## 2015-09-29 NOTE — L&D Delivery Note (Signed)
Delivery Note At 6:01 PM a viable female was delivered via OA Presentation Apgars 9 9 weight pending Placenta status: spontaneously with 3 vessel cord, .  Cord:  with the following complications:none .  Cord pH: not obtained  Anesthesia:  epidural Episiotomy:  none Lacerations:  first Suture Repair: 3.0 chromic Est. Blood Loss (mL):  300  Mom to postpartum.  Baby to Couplet care / Skin to Skin.  Ranyah Groeneveld L 06/21/2016, 6:12 PM

## 2015-11-15 ENCOUNTER — Inpatient Hospital Stay (HOSPITAL_COMMUNITY): Payer: 59

## 2015-11-15 ENCOUNTER — Encounter (HOSPITAL_COMMUNITY): Payer: Self-pay | Admitting: *Deleted

## 2015-11-15 ENCOUNTER — Inpatient Hospital Stay (HOSPITAL_COMMUNITY)
Admission: AD | Admit: 2015-11-15 | Discharge: 2015-11-15 | Disposition: A | Discharge status: Home / Self Care | Payer: 59 | Source: Ambulatory Visit | Attending: Obstetrics and Gynecology | Admitting: Obstetrics and Gynecology

## 2015-11-15 DIAGNOSIS — Z87442 Personal history of urinary calculi: Secondary | ICD-10-CM | POA: Diagnosis not present

## 2015-11-15 DIAGNOSIS — Z3A08 8 weeks gestation of pregnancy: Secondary | ICD-10-CM | POA: Diagnosis not present

## 2015-11-15 DIAGNOSIS — J189 Pneumonia, unspecified organism: Secondary | ICD-10-CM | POA: Diagnosis not present

## 2015-11-15 DIAGNOSIS — R0602 Shortness of breath: Secondary | ICD-10-CM

## 2015-11-15 DIAGNOSIS — O99511 Diseases of the respiratory system complicating pregnancy, first trimester: Secondary | ICD-10-CM | POA: Diagnosis not present

## 2015-11-15 DIAGNOSIS — J101 Influenza due to other identified influenza virus with other respiratory manifestations: Secondary | ICD-10-CM | POA: Diagnosis present

## 2015-11-15 DIAGNOSIS — O9989 Other specified diseases and conditions complicating pregnancy, childbirth and the puerperium: Secondary | ICD-10-CM | POA: Insufficient documentation

## 2015-11-15 DIAGNOSIS — R079 Chest pain, unspecified: Secondary | ICD-10-CM | POA: Diagnosis not present

## 2015-11-15 DIAGNOSIS — R05 Cough: Secondary | ICD-10-CM | POA: Diagnosis not present

## 2015-11-15 DIAGNOSIS — Z881 Allergy status to other antibiotic agents status: Secondary | ICD-10-CM | POA: Insufficient documentation

## 2015-11-15 LAB — CBC
HCT: 37 % (ref 36.0–46.0)
Hemoglobin: 12.8 g/dL (ref 12.0–15.0)
MCH: 31 pg (ref 26.0–34.0)
MCHC: 34.6 g/dL (ref 30.0–36.0)
MCV: 89.6 fL (ref 78.0–100.0)
PLATELETS: 202 10*3/uL (ref 150–400)
RBC: 4.13 MIL/uL (ref 3.87–5.11)
RDW: 13.9 % (ref 11.5–15.5)
WBC: 5.5 10*3/uL (ref 4.0–10.5)

## 2015-11-15 LAB — URINALYSIS, ROUTINE W REFLEX MICROSCOPIC
BILIRUBIN URINE: NEGATIVE
GLUCOSE, UA: NEGATIVE mg/dL
Hgb urine dipstick: NEGATIVE
Ketones, ur: NEGATIVE mg/dL
Leukocytes, UA: NEGATIVE
Nitrite: NEGATIVE
Protein, ur: NEGATIVE mg/dL
SPECIFIC GRAVITY, URINE: 1.02 (ref 1.005–1.030)
pH: 6 (ref 5.0–8.0)

## 2015-11-15 LAB — POCT PREGNANCY, URINE: Preg Test, Ur: POSITIVE — AB

## 2015-11-15 MED ORDER — CEFDINIR 300 MG PO CAPS: 300.0000 mg | ORAL_CAPSULE | Freq: Two times a day (BID) | ORAL | Status: AC

## 2015-11-15 MED ORDER — AZITHROMYCIN 250 MG PO TABS: ORAL_TABLET | ORAL | Status: AC

## 2015-11-15 MED ORDER — SODIUM CHLORIDE 0.9 % IV BOLUS (SEPSIS)
1000.0000 mL | Freq: Once | INTRAVENOUS | Status: AC
  Administered 2015-11-15: 1000 mL via INTRAVENOUS

## 2015-11-15 MED ORDER — DEXTROSE 5 % IV SOLN
2.0000 g | Freq: Once | INTRAVENOUS | Status: AC
  Administered 2015-11-15: 2 g via INTRAVENOUS
  Filled 2015-11-15: qty 2

## 2015-11-15 MED ORDER — LACTATED RINGERS IV BOLUS (SEPSIS)
1000.0000 mL | Freq: Once | INTRAVENOUS | Status: DC
  Administered 2015-11-15: 1000 mL via INTRAVENOUS

## 2015-11-15 NOTE — Discharge Instructions (Signed)

## 2015-11-15 NOTE — MAU Note (Signed)
Sick with the flu since Monday night, has been running fever, had some vomiting, went to urgent care on Wednesday tested positive for Influenza A, cough is worse, yellow sputum, shoulder pain, head pain, dizziness.

## 2015-11-15 NOTE — MAU Provider Note (Signed)
History     CSN: [id number]  Arrival date and time: 11/15/15 1401   None     Chief Complaint  Patient presents with  . Influenza   HPI Pamela Gamble 35 y.o. [id number] @ 8w2  presents to MAU after tsing positive for Flu type A 4 days ago, her symptoms worsened today including feeling winded, fatigue and overall malaise. She has been taking tamiflu.   Past Medical History  Diagnosis Date  . Kidney stones   . Anxiety     Past Surgical History  Procedure Laterality Date  . Inguinal hernia repair    . Facial lacerations repair      Family History  Problem Relation Age of Onset  . Hypertension Mother   . Cancer Father   . Cancer Paternal Uncle   . Hypertension Paternal Grandmother     Social History  Substance Use Topics  . Smoking status: Never Smoker   . Smokeless tobacco: Never Used  . Alcohol Use: No    Allergies:  Allergies  Allergen Reactions  . Bactrim [Sulfamethoxazole-Trimethoprim] Rash    Prescriptions prior to admission  Medication Sig Dispense Refill Last Dose  . acetaminophen (TYLENOL) 500 MG tablet Take 1,000 mg by mouth every 6 (six) hours as needed for moderate pain, fever or headache.   11/15/2015 at Unknown time  . guaiFENesin-dextromethorphan (ROBITUSSIN DM) 100-10 MG/5ML syrup Take 5 mLs by mouth every 4 (four) hours as needed for cough.   11/14/2015 at Unknown time  . loratadine (CLARITIN) 10 MG tablet Take 10 mg by mouth daily.   11/15/2015 at Unknown time  . oseltamivir (TAMIFLU) 75 MG capsule Take 75 mg by mouth 2 (two) times daily.   11/15/2015 at Unknown time  . Prenatal Vit-Fe Fumarate-FA (PRENATAL MULTIVITAMIN) TABS tablet Take 1 tablet by mouth daily at 12 noon.   11/15/2015 at Unknown time  . valACYclovir (VALTREX) 1000 MG tablet Take 2,000 mg by mouth 2 (two) times daily.   11/14/2015 at Unknown time  . benzocaine-Menthol (DERMOPLAST) 20-0.5 % AERO Apply 1 application topically 3 (three) times daily as needed (perineal discomfort). (Patient  not taking: Reported on 11/15/2015) 56 g 2 Not Taking at Unknown time  . ibuprofen (ADVIL,MOTRIN) 600 MG tablet Take 1 tablet (600 mg total) by mouth every 6 (six) hours as needed. (Patient not taking: Reported on 11/15/2015) 30 tablet 0 Not Taking at Unknown time  . oxyCODONE-acetaminophen (PERCOCET/ROXICET) 5-325 MG per tablet Take 1-2 tablets by mouth every 6 (six) hours as needed (for pain scale less than 7). (Patient not taking: Reported on 11/15/2015) 30 tablet 0 Not Taking at Unknown time    Review of Systems  Constitutional: Positive for malaise/fatigue. Negative for fever.  Respiratory: Positive for cough, sputum production and shortness of breath.   All other systems reviewed and are negative.  Physical Exam   Blood pressure 126/93, pulse 82, temperature 97.9 F (36.6 C), temperature source Oral, resp. rate 20, height  (1.626 m), weight 66.044 kg (145 lb 9.6 oz), last menstrual period 09/18/2015, SpO2 100 %, unknown if currently breastfeeding.  Physical Exam  Nursing note and vitals reviewed. Constitutional: She is oriented to person, place, and time. She appears well-developed and well-nourished. No distress.  HENT:  Head: Normocephalic and atraumatic.  Neck: Normal range of motion.  Respiratory: Effort normal and breath sounds normal. No respiratory distress. She has no wheezes. She has no rales. She exhibits no tenderness.  GI: Soft. She exhibits no distension. There is no  tenderness.  Musculoskeletal: Normal range of motion.  Neurological: She is alert and oriented to person, place, and time.  Skin: Skin is warm and dry.  Psychiatric: She has a normal mood and affect. Her behavior is normal. Judgment and thought content normal.  Dg Chest 2 View  11/15/2015  CLINICAL DATA:  Left-sided chest pain with cough and congestion for 5 days EXAM: CHEST  2 VIEW COMPARISON:  None. FINDINGS: On the lateral view, there is an ill-defined focus of increased opacity in the inferior  anterior mediastinum. The lungs elsewhere are clear. Heart size and pulmonary vascularity are normal. No adenopathy. No bone lesions. IMPRESSION: Suspect small focus of infiltrate anteriorly, seen only on the lateral view slightly anterior to the heart. Lungs elsewhere clear. Cardiac silhouette within normal limits. Electronically Signed   By: Bretta Bang III M.D.   On: 11/15/2015 16:02   Results for orders placed or performed during the hospital encounter of 11/15/15 (from the past 24 hour(s))  Urinalysis, Routine w reflex microscopic (not at [facility])     Status: None   Collection Time: 11/15/15  2:15 PM  Result Value Ref Range   Color, Urine YELLOW YELLOW   APPearance CLEAR CLEAR   Specific Gravity, Urine 1.020 1.005 - 1.030   pH 6.0 5.0 - 8.0   Glucose, UA NEGATIVE NEGATIVE mg/dL   Hgb urine dipstick NEGATIVE NEGATIVE   Bilirubin Urine NEGATIVE NEGATIVE   Ketones, ur NEGATIVE NEGATIVE mg/dL   Protein, ur NEGATIVE NEGATIVE mg/dL   Nitrite NEGATIVE NEGATIVE   Leukocytes, UA NEGATIVE NEGATIVE  CBC     Status: None   Collection Time: 11/15/15  4:15 PM  Result Value Ref Range   WBC 5.5 4.0 - 10.5 K/uL   RBC 4.13 3.87 - 5.11 MIL/uL   Hemoglobin 12.8 12.0 - 15.0 g/dL   HCT 40.9 81.1 - 91.4 %   MCV 89.6 78.0 - 100.0 fL   MCH 31.0 26.0 - 34.0 pg   MCHC 34.6 30.0 - 36.0 g/dL   RDW 78.2 95.6 - 21.3 %   Platelets 202 150 - 400 K/uL    MAU Course  Procedures  MDM  Pneumonia and Influenza; IV antibiotics and POC discussed with Dr. Rana Snare. Dr Rana Snare on his way to see pt and discuss admission or treatment on outpatient basis. Assessment and Plan  Pneumonia Influenza Type A Z Pac Omnicef  PO BID x 7 d Discharge    Clemmons,Lori Grissett 11/15/2015, 5:11 PM

## 2015-11-18 LAB — OB RESULTS CONSOLE HIV ANTIBODY (ROUTINE TESTING): HIV: NONREACTIVE

## 2015-11-18 LAB — OB RESULTS CONSOLE RUBELLA ANTIBODY, IGM: RUBELLA: IMMUNE

## 2015-11-18 LAB — OB RESULTS CONSOLE RPR: RPR: NONREACTIVE

## 2015-11-18 LAB — OB RESULTS CONSOLE HEPATITIS B SURFACE ANTIGEN: Hepatitis B Surface Ag: NEGATIVE

## 2015-11-18 LAB — OB RESULTS CONSOLE GC/CHLAMYDIA
Chlamydia: NEGATIVE
GC PROBE AMP, GENITAL: NEGATIVE

## 2015-11-18 LAB — OB RESULTS CONSOLE ABO/RH: RH Type: POSITIVE

## 2015-11-18 LAB — OB RESULTS CONSOLE ANTIBODY SCREEN: Antibody Screen: NEGATIVE

## 2016-05-19 ENCOUNTER — Inpatient Hospital Stay (HOSPITAL_COMMUNITY)
Admission: AD | Admit: 2016-05-19 | Discharge: 2016-05-19 | Disposition: A | Discharge status: Home / Self Care | Payer: 59 | Source: Ambulatory Visit | Attending: Obstetrics and Gynecology | Admitting: Obstetrics and Gynecology

## 2016-05-19 ENCOUNTER — Encounter (HOSPITAL_COMMUNITY): Payer: Self-pay

## 2016-05-19 DIAGNOSIS — Z3A34 34 weeks gestation of pregnancy: Secondary | ICD-10-CM | POA: Insufficient documentation

## 2016-05-19 DIAGNOSIS — Z8249 Family history of ischemic heart disease and other diseases of the circulatory system: Secondary | ICD-10-CM | POA: Diagnosis not present

## 2016-05-19 DIAGNOSIS — Z882 Allergy status to sulfonamides status: Secondary | ICD-10-CM | POA: Insufficient documentation

## 2016-05-19 DIAGNOSIS — O4703 False labor before 37 completed weeks of gestation, third trimester: Secondary | ICD-10-CM | POA: Diagnosis not present

## 2016-05-19 LAB — URINALYSIS, ROUTINE W REFLEX MICROSCOPIC
BILIRUBIN URINE: NEGATIVE
Glucose, UA: NEGATIVE mg/dL
KETONES UR: NEGATIVE mg/dL
Leukocytes, UA: NEGATIVE
Nitrite: NEGATIVE
Protein, ur: NEGATIVE mg/dL
Specific Gravity, Urine: 1.01 (ref 1.005–1.030)
pH: 6 (ref 5.0–8.0)

## 2016-05-19 LAB — URINE MICROSCOPIC-ADD ON

## 2016-05-19 MED ORDER — LACTATED RINGERS IV BOLUS (SEPSIS)
1000.0000 mL | Freq: Once | INTRAVENOUS | Status: AC
  Administered 2016-05-19: 1000 mL via INTRAVENOUS

## 2016-05-19 MED ORDER — NIFEDIPINE 10 MG PO CAPS
10.0000 mg | ORAL_CAPSULE | ORAL | Status: AC
  Administered 2016-05-19 (×2): 10 mg via ORAL
  Filled 2016-05-19 (×2): qty 1

## 2016-05-19 MED ORDER — NIFEDIPINE 10 MG PO CAPS
20.0000 mg | ORAL_CAPSULE | Freq: Once | ORAL | Status: AC
  Administered 2016-05-19: 20 mg via ORAL
  Filled 2016-05-19: qty 2

## 2016-05-19 NOTE — Discharge Instructions (Signed)

## 2016-05-19 NOTE — MAU Note (Signed)
Patient presents with c/o of ctx since 0100. Patient says she saw pink when she wiped. Denies LOF. Fetus active.

## 2016-05-19 NOTE — MAU Provider Note (Signed)
History     CSN: [id number]  Arrival date and time: 05/19/16 [id number]   First Provider Initiated Contact with Patient 05/19/16 [phone]      Chief Complaint  Patient presents with  . Contractions   Pamela Gamble is a 35 y.o. G3P2002 at 10357w6d who presents today with contractions. She states that the contractions began around 0100, and are about every 5 mins. She rates her pain 6/10 at this time. She denies any LOF. She did have some spotting when she wiped upon arrival. She confirms normal fetal movement. She denies any complicatiosn with this pregnancy or her other two pregnancies. She states that her first two were born at full. Her next appointment is 05/26/16.   Pelvic Pain  The patient's primary symptoms include pelvic pain. This is a new problem. The current episode started today. The problem occurs intermittently. The problem has been unchanged. Pain severity now: 6/10  The problem affects both sides. She is pregnant. Associated symptoms include abdominal pain and dysuria ("I had pressure when I was urinating earlier today, but it is better now."). Pertinent negatives include no chills, constipation, diarrhea, fever, frequency, nausea, urgency or vomiting. The vaginal discharge was normal. There has been no bleeding. Nothing aggravates the symptoms. She has tried nothing for the symptoms.   Past Medical History:  Diagnosis Date  . Anxiety   . Kidney stones     Past Surgical History:  Procedure Laterality Date  . FACIAL LACERATIONS REPAIR    . INGUINAL HERNIA REPAIR      Family History  Problem Relation Age of Onset  . Hypertension Mother   . Cancer Father   . Cancer Paternal Uncle   . Hypertension Paternal Grandmother     Social History  Substance Use Topics  . Smoking status: Never Smoker  . Smokeless tobacco: Never Used  . Alcohol use No    Allergies:  Allergies  Allergen Reactions  . Bactrim [Sulfamethoxazole-Trimethoprim] Rash    Prescriptions Prior to Admission   Medication Sig Dispense Refill Last Dose  . acetaminophen (TYLENOL) 500 MG tablet Take 1,000 mg by mouth every 6 (six) hours as needed for moderate pain, fever or headache.   11/15/2015 at Unknown time  . guaiFENesin-dextromethorphan (ROBITUSSIN DM) 100-10 MG/5ML syrup Take 5 mLs by mouth every 4 (four) hours as needed for cough.   11/14/2015 at Unknown time  . loratadine (CLARITIN) 10 MG tablet Take 10 mg by mouth daily.   11/15/2015 at Unknown time  . oseltamivir (TAMIFLU) 75 MG capsule Take 75 mg by mouth 2 (two) times daily.   11/15/2015 at Unknown time  . Prenatal Vit-Fe Fumarate-FA (PRENATAL MULTIVITAMIN) TABS tablet Take 1 tablet by mouth daily at 12 noon.   11/15/2015 at Unknown time  . valACYclovir (VALTREX) 1000 MG tablet Take 2,000 mg by mouth 2 (two) times daily.   11/14/2015 at Unknown time    Review of Systems  Constitutional: Negative for chills and fever.  Gastrointestinal: Positive for abdominal pain. Negative for constipation, diarrhea, nausea and vomiting.  Genitourinary: Positive for dysuria ("I had pressure when I was urinating earlier today, but it is better now.") and pelvic pain. Negative for frequency and urgency.   Physical Exam   Blood pressure 119/64, pulse 77, temperature 97.9 F (36.6 C), temperature source Oral, resp. rate 17, last menstrual period 09/18/2015, unknown if currently breastfeeding.  Physical Exam  Nursing note and vitals reviewed. Constitutional: She is oriented to person, place, and time. She appears well-developed and well-nourished.  No distress.  HENT:  Head: Normocephalic.  Cardiovascular: Normal rate.   Respiratory: Effort normal.  GI: Soft. There is no tenderness.  Genitourinary:  Genitourinary Comments: Cervix: 1/thick/-3  Neurological: She is alert and oriented to person, place, and time.  Skin: Skin is warm and dry.  Psychiatric: She has a normal mood and affect.   FHT: 125, moderate with 15x15 accels, no decels Toco: q 3-5 min  contractions  MAU Course  Procedures  MDM [id number]: D/W Dr. Henderson Cloudomblin, will give procardia and IV fluids. If contractions stop then she can be DC home. If not then call him back.   0502: Patient has had 2 contractions is 40 mins, and reports that they are not as strong as before either.   Assessment and Plan   1. Preterm contractions, third trimester   2. [redacted] weeks gestation of pregnancy    DC home Comfort measures reviewed  3rd Trimester precautions  PTL precautions  Fetal kick counts RX: none  Return to MAU as needed FU with OB as planned  Follow-up Information    LOWE,DAVID C, MD .   Specialty:  Obstetrics and Gynecology Contact information: [address], [address] [place] [address] [phone]            Tawnya CrookHogan, Heather Donovan 05/19/2016, 3:27 AM

## 2016-05-20 LAB — URINE CULTURE: CULTURE: NO GROWTH

## 2016-05-26 LAB — OB RESULTS CONSOLE GBS: GBS: POSITIVE

## 2016-06-21 ENCOUNTER — Inpatient Hospital Stay (HOSPITAL_COMMUNITY)
Admission: AD | Admit: 2016-06-21 | Discharge: 2016-06-22 | DRG: 775 | Disposition: A | Discharge status: Home / Self Care | Payer: 59 | Source: Ambulatory Visit | Attending: Obstetrics and Gynecology | Admitting: Obstetrics and Gynecology

## 2016-06-21 ENCOUNTER — Inpatient Hospital Stay (HOSPITAL_COMMUNITY): Payer: 59 | Admitting: Anesthesiology

## 2016-06-21 ENCOUNTER — Encounter (HOSPITAL_COMMUNITY): Payer: Self-pay

## 2016-06-21 DIAGNOSIS — Z3A39 39 weeks gestation of pregnancy: Secondary | ICD-10-CM | POA: Diagnosis not present

## 2016-06-21 DIAGNOSIS — Z3483 Encounter for supervision of other normal pregnancy, third trimester: Secondary | ICD-10-CM | POA: Diagnosis present

## 2016-06-21 DIAGNOSIS — Z8249 Family history of ischemic heart disease and other diseases of the circulatory system: Secondary | ICD-10-CM | POA: Diagnosis not present

## 2016-06-21 DIAGNOSIS — IMO0001 Reserved for inherently not codable concepts without codable children: Secondary | ICD-10-CM

## 2016-06-21 LAB — TYPE AND SCREEN
ABO/RH(D): B POS
Antibody Screen: NEGATIVE

## 2016-06-21 LAB — CBC
HCT: 32.9 % — ABNORMAL LOW (ref 36.0–46.0)
Hemoglobin: 11.3 g/dL — ABNORMAL LOW (ref 12.0–15.0)
MCH: 29.7 pg (ref 26.0–34.0)
MCHC: 34.3 g/dL (ref 30.0–36.0)
MCV: 86.4 fL (ref 78.0–100.0)
Platelets: 211 K/uL (ref 150–400)
RBC: 3.81 MIL/uL — ABNORMAL LOW (ref 3.87–5.11)
RDW: 15.8 % — ABNORMAL HIGH (ref 11.5–15.5)
WBC: 9 K/uL (ref 4.0–10.5)

## 2016-06-21 LAB — RPR: RPR Ser Ql: NONREACTIVE

## 2016-06-21 MED ORDER — PHENYLEPHRINE 40 MCG/ML (10ML) SYRINGE FOR IV PUSH (FOR BLOOD PRESSURE SUPPORT)
80.0000 ug | PREFILLED_SYRINGE | INTRAVENOUS | Status: DC | PRN
  Administered 2016-06-21: 80 ug via INTRAVENOUS
  Filled 2016-06-21: qty 10
  Filled 2016-06-21: qty 5

## 2016-06-21 MED ORDER — MEASLES, MUMPS & RUBELLA VAC ~~LOC~~ INJ
0.5000 mL | INJECTION | Freq: Once | SUBCUTANEOUS | Status: DC
  Filled 2016-06-21: qty 0.5

## 2016-06-21 MED ORDER — ONDANSETRON HCL 4 MG/2ML IJ SOLN
4.0000 mg | Freq: Four times a day (QID) | INTRAMUSCULAR | Status: DC | PRN
  Administered 2016-06-21: 4 mg via INTRAVENOUS
  Filled 2016-06-21: qty 2

## 2016-06-21 MED ORDER — FENTANYL 2.5 MCG/ML BUPIVACAINE 1/10 % EPIDURAL INFUSION (WH - ANES)
14.0000 mL/h | INTRAMUSCULAR | Status: DC | PRN
  Administered 2016-06-21 (×2): 14 mL/h via EPIDURAL
  Filled 2016-06-21: qty 125

## 2016-06-21 MED ORDER — LIDOCAINE HCL (PF) 1 % IJ SOLN
30.0000 mL | INTRAMUSCULAR | Status: DC | PRN
  Filled 2016-06-21: qty 30

## 2016-06-21 MED ORDER — OXYTOCIN 40 UNITS IN LACTATED RINGERS INFUSION - SIMPLE MED
2.5000 [IU]/h | INTRAVENOUS | Status: DC
Start: 2016-06-21 — End: 2016-06-21

## 2016-06-21 MED ORDER — ACETAMINOPHEN 325 MG PO TABS
650.0000 mg | ORAL_TABLET | ORAL | Status: DC | PRN
  Administered 2016-06-22: 650 mg via ORAL
  Filled 2016-06-21: qty 2

## 2016-06-21 MED ORDER — PHENYLEPHRINE 40 MCG/ML (10ML) SYRINGE FOR IV PUSH (FOR BLOOD PRESSURE SUPPORT)
80.0000 ug | PREFILLED_SYRINGE | INTRAVENOUS | Status: DC | PRN
  Filled 2016-06-21: qty 10
  Filled 2016-06-21: qty 5

## 2016-06-21 MED ORDER — EPHEDRINE 5 MG/ML INJ
10.0000 mg | INTRAVENOUS | Status: DC | PRN
Start: 2016-06-21 — End: 2016-06-21
  Filled 2016-06-21: qty 4

## 2016-06-21 MED ORDER — LACTATED RINGERS IV SOLN
500.0000 mL | Freq: Once | INTRAVENOUS | Status: AC
  Administered 2016-06-21: 500 mL via INTRAVENOUS

## 2016-06-21 MED ORDER — OXYTOCIN BOLUS FROM INFUSION: 500.0000 mL | Freq: Once | INTRAVENOUS | Status: DC

## 2016-06-21 MED ORDER — WITCH HAZEL-GLYCERIN EX PADS: 1.0000 "application " | MEDICATED_PAD | CUTANEOUS | Status: DC | PRN

## 2016-06-21 MED ORDER — COCONUT OIL OIL
1.0000 | TOPICAL_OIL | Status: DC | PRN
Start: 2016-06-21 — End: 2016-06-22

## 2016-06-21 MED ORDER — DIPHENHYDRAMINE HCL 25 MG PO CAPS: 25.0000 mg | ORAL_CAPSULE | Freq: Four times a day (QID) | ORAL | Status: DC | PRN

## 2016-06-21 MED ORDER — OXYCODONE-ACETAMINOPHEN 5-325 MG PO TABS: 2.0000 | ORAL_TABLET | ORAL | Status: DC | PRN

## 2016-06-21 MED ORDER — DIBUCAINE 1 % RE OINT: 1.0000 "application " | TOPICAL_OINTMENT | RECTAL | Status: DC | PRN

## 2016-06-21 MED ORDER — TETANUS-DIPHTH-ACELL PERTUSSIS 5-2.5-18.5 LF-MCG/0.5 IM SUSP: 0.5000 mL | Freq: Once | INTRAMUSCULAR | Status: DC

## 2016-06-21 MED ORDER — SOD CITRATE-CITRIC ACID 500-334 MG/5ML PO SOLN: 30.0000 mL | ORAL | Status: DC | PRN

## 2016-06-21 MED ORDER — IBUPROFEN 600 MG PO TABS
600.0000 mg | ORAL_TABLET | Freq: Four times a day (QID) | ORAL | Status: DC
  Administered 2016-06-21 – 2016-06-22 (×4): 600 mg via ORAL
  Filled 2016-06-21 (×4): qty 1

## 2016-06-21 MED ORDER — PENICILLIN G POTASSIUM 5000000 UNITS IJ SOLR
2.5000 10*6.[IU] | INTRAVENOUS | Status: DC
  Administered 2016-06-21: 2.5 10*6.[IU] via INTRAVENOUS
  Filled 2016-06-21 (×4): qty 2.5

## 2016-06-21 MED ORDER — LACTATED RINGERS IV SOLN
500.0000 mL | INTRAVENOUS | Status: DC | PRN
  Administered 2016-06-21 (×2): 500 mL via INTRAVENOUS

## 2016-06-21 MED ORDER — SENNOSIDES-DOCUSATE SODIUM 8.6-50 MG PO TABS
2.0000 | ORAL_TABLET | ORAL | Status: DC
Start: 2016-06-22 — End: 2016-06-22
  Administered 2016-06-22: 2 via ORAL
  Filled 2016-06-21: qty 2

## 2016-06-21 MED ORDER — BISACODYL 10 MG RE SUPP: 10.0000 mg | Freq: Every day | RECTAL | Status: DC | PRN

## 2016-06-21 MED ORDER — OXYCODONE-ACETAMINOPHEN 5-325 MG PO TABS: 1.0000 | ORAL_TABLET | ORAL | Status: DC | PRN

## 2016-06-21 MED ORDER — SIMETHICONE 80 MG PO CHEW: 80.0000 mg | CHEWABLE_TABLET | ORAL | Status: DC | PRN

## 2016-06-21 MED ORDER — PRENATAL MULTIVITAMIN CH
1.0000 | ORAL_TABLET | Freq: Every day | ORAL | Status: DC
  Administered 2016-06-22: 1 via ORAL
  Filled 2016-06-21: qty 1

## 2016-06-21 MED ORDER — OXYTOCIN 40 UNITS IN LACTATED RINGERS INFUSION - SIMPLE MED
1.0000 m[IU]/min | INTRAVENOUS | Status: DC
  Administered 2016-06-21: 2 m[IU]/min via INTRAVENOUS
  Filled 2016-06-21: qty 1000

## 2016-06-21 MED ORDER — LACTATED RINGERS IV SOLN
INTRAVENOUS | Status: DC
  Administered 2016-06-21 (×3): via INTRAVENOUS

## 2016-06-21 MED ORDER — DIPHENHYDRAMINE HCL 50 MG/ML IJ SOLN: 12.5000 mg | INTRAMUSCULAR | Status: DC | PRN

## 2016-06-21 MED ORDER — ONDANSETRON HCL 4 MG PO TABS: 4.0000 mg | ORAL_TABLET | ORAL | Status: DC | PRN

## 2016-06-21 MED ORDER — PENICILLIN G POTASSIUM 5000000 UNITS IJ SOLR
5.0000 10*6.[IU] | Freq: Once | INTRAMUSCULAR | Status: AC
  Administered 2016-06-21: 5 10*6.[IU] via INTRAVENOUS
  Filled 2016-06-21: qty 5

## 2016-06-21 MED ORDER — ONDANSETRON HCL 4 MG/2ML IJ SOLN: 4.0000 mg | INTRAMUSCULAR | Status: DC | PRN

## 2016-06-21 MED ORDER — TERBUTALINE SULFATE 1 MG/ML IJ SOLN
0.2500 mg | Freq: Once | INTRAMUSCULAR | Status: DC | PRN
  Filled 2016-06-21: qty 1

## 2016-06-21 MED ORDER — BENZOCAINE-MENTHOL 20-0.5 % EX AERO
1.0000 "application " | INHALATION_SPRAY | CUTANEOUS | Status: DC | PRN
  Administered 2016-06-21: 1 via TOPICAL
  Filled 2016-06-21: qty 56

## 2016-06-21 MED ORDER — ACETAMINOPHEN 325 MG PO TABS: 650.0000 mg | ORAL_TABLET | ORAL | Status: DC | PRN

## 2016-06-21 MED ORDER — LIDOCAINE HCL (PF) 1 % IJ SOLN
INTRAMUSCULAR | Status: DC | PRN
  Administered 2016-06-21 (×2): 4 mL via EPIDURAL

## 2016-06-21 MED ORDER — MEDROXYPROGESTERONE ACETATE 150 MG/ML IM SUSP: 150.0000 mg | INTRAMUSCULAR | Status: DC | PRN

## 2016-06-21 MED ORDER — FLEET ENEMA 7-19 GM/118ML RE ENEM: 1.0000 | ENEMA | Freq: Every day | RECTAL | Status: DC | PRN

## 2016-06-21 MED ORDER — ZOLPIDEM TARTRATE 5 MG PO TABS: 5.0000 mg | ORAL_TABLET | Freq: Every evening | ORAL | Status: DC | PRN

## 2016-06-21 MED ORDER — EPHEDRINE 5 MG/ML INJ
10.0000 mg | INTRAVENOUS | Status: DC | PRN
  Filled 2016-06-21: qty 4

## 2016-06-21 NOTE — Anesthesia Preprocedure Evaluation (Addendum)
Anesthesia Evaluation  Patient identified by MRN, date of birth, ID band Patient awake    Reviewed: Allergy & Precautions, H&P , Patient's Chart, lab work & pertinent test results  Airway Mallampati: II  TM Distance: >3 FB Neck ROM: full    Dental  (+) Teeth Intact   Pulmonary    breath sounds clear to auscultation       Cardiovascular  Rhythm:regular Rate:Normal     Neuro/Psych PSYCHIATRIC DISORDERS Anxiety    GI/Hepatic   Endo/Other    Renal/GU      Musculoskeletal   Abdominal   Peds  Hematology   Anesthesia Other Findings Pregnancy - uncomplicated Platelets and allergies reviewed Denies active cardiac or pulmonary symptoms, METS > 4  Denies blood thinning medications, bleeding disorders, hypertension, asthma, supine hypotension syndrome, previous anesthesia difficulties   Reproductive/Obstetrics (+) Pregnancy                            Anesthesia Physical  Anesthesia Plan  ASA: II  Anesthesia Plan: Epidural   Post-op Pain Management:    Induction:   Airway Management Planned:   Additional Equipment:   Intra-op Plan:   Post-operative Plan:   Informed Consent: I have reviewed the patients History and Physical, chart, labs and discussed the procedure including the risks, benefits and alternatives for the proposed anesthesia with the patient or authorized representative who has indicated his/her understanding and acceptance.     Plan Discussed with:   Anesthesia Plan Comments:         Anesthesia Quick Evaluation

## 2016-06-21 NOTE — H&P (Signed)
Pamela Gamble is a [age] year old G 3 P 1 at 3539 w 4 days presents in labor. OB History    Gravida Para Term Preterm AB Living   3 2 2  0 0 2   SAB TAB Ectopic Multiple Live Births   0 0 0 0 2     Past Medical History:  Diagnosis Date  . Anxiety   . Kidney stones    Past Surgical History:  Procedure Laterality Date  . FACIAL LACERATIONS REPAIR    . INGUINAL HERNIA REPAIR     Family History: family history includes Cancer in her father and paternal uncle; Hypertension in her mother and paternal grandmother. Social History:  reports that she has never smoked. She has never used smokeless tobacco. She reports that she does not drink alcohol or use drugs.     Maternal Diabetes: No Genetic Screening: Normal Maternal Ultrasounds/Referrals: Normal Fetal Ultrasounds or other Referrals:  None Maternal Substance Abuse:  No Significant Maternal Medications:  None Significant Maternal Lab Results:  None Other Comments:  None  Review of Systems  All other systems reviewed and are negative.  Maternal Medical History:  Reason for admission: Contractions.   Prenatal complications: no prenatal complications   Dilation: 3 Effacement (%): 50 Station: -2 Exam by:: Pamela Josephsheryl Anderson RN Blood pressure 123/70, pulse 97, temperature 97.7 F (36.5 C), temperature source Oral, resp. rate 18, last menstrual period 09/18/2015, unknown if currently breastfeeding. Maternal Exam:  Uterine Assessment: Contraction strength is moderate.  Abdomen: Fetal presentation: vertex     Physical Exam  Nursing note and vitals reviewed. Constitutional: She appears well-developed and well-nourished.  HENT:  Head: Normocephalic and atraumatic.  Eyes: Pupils are equal, round, and reactive to light.  Neck: Normal range of motion.  Cardiovascular: Normal rate.     Prenatal labs: ABO, Rh:   Antibody:   Rubella:   RPR:    HBsAg:    HIV:    GBS:     Assessment/Plan: IUP at 39 w 4 days Labor Admit AROM  after Admission GBBS+ - penicillin Pamela Gamble 06/21/2016, 8:33 AM

## 2016-06-21 NOTE — MAU Note (Signed)
Dr. Vincente PoliGrewal in to see patient, per MD patient will walk while awaiting room assignment.

## 2016-06-21 NOTE — Progress Notes (Signed)
Patient is waiting for epidural FHR is category 1 Toco irregular UCs Cervix is not changed AROM Clear fluid scant Epidural Start pitocin prn

## 2016-06-21 NOTE — Anesthesia Procedure Notes (Signed)
Epidural Patient location during procedure: OB  Staffing Anesthesiologist: Ina Poupard Performed: anesthesiologist   Preanesthetic Checklist Completed: patient identified, site marked, surgical consent, pre-op evaluation, timeout performed, IV checked, risks and benefits discussed and monitors and equipment checked  Epidural Patient position: sitting Prep: site prepped and draped and DuraPrep Patient monitoring: continuous pulse ox and blood pressure Approach: midline Location: L3-L4 Injection technique: LOR saline  Needle:  Needle type: Tuohy  Needle gauge: 17 G Needle length: 9 cm and 9 Needle insertion depth: 6 cm Catheter type: closed end flexible Catheter size: 19 Gauge Catheter at skin depth: 10 cm Test dose: negative  Assessment Events: blood not aspirated, injection not painful, no injection resistance, negative IV test and no paresthesia  Additional Notes Patient identified. Risks/Benefits/Options discussed with patient including but not limited to bleeding, infection, nerve damage, paralysis, failed block, incomplete pain control, headache, blood pressure changes, nausea, vomiting, reactions to medications, itching and postpartum back pain. Confirmed with bedside nurse the patient's most recent platelet count. Confirmed with patient that they are not currently taking any anticoagulation, have any bleeding history or any family history of bleeding disorders. Patient expressed understanding and wished to proceed. All questions were answered. Sterile technique was used throughout the entire procedure. Please see nursing notes for vital signs. Test dose was given through epidural catheter and negative prior to continuing to dose epidural or start infusion. Warning signs of high block given to the patient including shortness of breath, tingling/numbness in hands, complete motor block, or any concerning symptoms with instructions to call for help. Patient was given instructions  on fall risk and not to get out of bed. All questions and concerns addressed with instructions to call with any issues or inadequate analgesia.  Reason for block:at surgeon's request and procedure for pain

## 2016-06-21 NOTE — MAU Note (Signed)
Pt presents complaining of contractions since 0145 this am. Denies bleeding or leaking of fluid. Reports good fetal movement.

## 2016-06-22 LAB — CBC
HCT: 29.8 % — ABNORMAL LOW (ref 36.0–46.0)
Hemoglobin: 10.2 g/dL — ABNORMAL LOW (ref 12.0–15.0)
MCH: 29.6 pg (ref 26.0–34.0)
MCHC: 34.2 g/dL (ref 30.0–36.0)
MCV: 86.4 fL (ref 78.0–100.0)
PLATELETS: 187 10*3/uL (ref 150–400)
RBC: 3.45 MIL/uL — AB (ref 3.87–5.11)
RDW: 15.9 % — AB (ref 11.5–15.5)
WBC: 7.1 10*3/uL (ref 4.0–10.5)

## 2016-06-22 MED ORDER — INFLUENZA VAC SPLIT QUAD 0.5 ML IM SUSY
0.5000 mL | PREFILLED_SYRINGE | INTRAMUSCULAR | Status: AC
  Administered 2016-06-22: 0.5 mL via INTRAMUSCULAR

## 2016-06-22 NOTE — Clinical Social Work Maternal (Signed)
  CLINICAL SOCIAL WORK MATERNAL/CHILD NOTE  Patient Details  Name: Pamela Gamble MRN: [id number] Date of Birth: 1981/01/12  Date:  06/22/2016  Clinical Social Worker Initiating Note:  Laurey Arrow Date/ Time Initiated:  06/22/16/1440     Child's Name:  Pamela Gamble   Legal Guardian:  Mother   Need for Interpreter:  None   Date of Referral:  06/22/16     Reason for Referral:  Behavioral Health Issues, including SI    Referral Source:  Central Nursery   Address:  [place] [id number]  Phone number:  [phone]   Household Members:  Self, Minor Children, Spouse   Natural Supports (not living in the home):  Friends, Immediate Family, Extended Family, Spouse/significant other   Professional Supports: None   Employment: Full-time   Type of Work: MD at [employer]   Education:  Financial controller Resources:  Multimedia programmer   Other Resources:      Cultural/Religious Considerations Which May Impact Care:  None Reported  Strengths:  Ability to meet basic needs , Lexicographer chosen , Home prepared for child , Understanding of illness   Risk Factors/Current Problems:  Mental Health Concerns    Cognitive State:  Alert , Insightful    Mood/Affect:  Calm , Flat , Comfortable    CSW Assessment: CSW ,met with MOB to complete an assessment for hx of anxiety d/o.  MOB was inviting and polite.  MOB affect was flat, however, MOB was engaged.  MOB gave CSW permission to meet with MOB while MOB's husband (Pamela Gamble) was present.  CSW inquired about MOB's mental health and MOB reported a hx of anxiety since age 67.  MOB communicated an increase in anxiety symptoms during MOB's pregnancy.  MOB stated that MOB consulted with MOB's OBGYN office and MOB was prescribed a medication to assist MOB with sleeping at night.  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  CSW encouraged MOB to  reach to seek medical attention if needed for increased signs and symptoms for PPD. MOB declined all other resources offered by CSW.  MOB had not questions at this time.    CSW Plan/Description:  Patient/Family Education , No Further Intervention Required/No Barriers to Discharge   Laurey Arrow, MSW, LCSW Clinical Social Work [phone]   Dimple Nanas, LCSW 06/22/2016, 2:51 PM

## 2016-06-22 NOTE — Anesthesia Postprocedure Evaluation (Signed)
Anesthesia Post Note  Patient: Pamela Gamble  Procedure(s) Performed: * No procedures listed *  Patient location during evaluation: Mother Baby Anesthesia Type: Epidural Level of consciousness: awake and alert Pain management: pain level controlled Vital Signs Assessment: post-procedure vital signs reviewed and stable Respiratory status: spontaneous breathing, nonlabored ventilation and respiratory function stable Cardiovascular status: stable Postop Assessment: no headache, no backache and epidural receding Anesthetic complications: no     Last Vitals:  Vitals:   06/22/16 0029 06/22/16 0633  BP: (!) 94/51 (!) 92/49  Pulse: 74 61  Resp: 18 16  Temp: 36.4 C 36.6 C    Last Pain:  Vitals:   06/22/16 0633  TempSrc: Oral  PainSc:    Pain Goal: Patients Stated Pain Goal: 2 (06/21/16 0935)               Junious SilkGILBERT,Rebekkah Powless

## 2016-06-22 NOTE — Lactation Note (Addendum)
This note was copied from a baby's chart. Lactation Consultation Note Experienced BF mom BF her other children using NS d/t painful latching. Mom states she has latched this baby a few times w/o NS comfortable, then other times it hurt. Mom has short shaft nipples that are very compressible to obtain a deep latch if baby would pull nipple into mouth w/wide flange. Baby kept biting mom causing a lot of pain. Mom requested NS. Fitted w/#16 NS. Baby sleepy, needing stimulation. Latched w/a few sucks. Mom stated NS felt comfortable. Baby very sleepy and has no interest in BF at this time.  Mom encouraged to feed baby 8-12 times/24 hours and with feeding cues. Referred to Baby and Me Book in Breastfeeding section Pg. 22-23 for position options and Proper latch demonstration. Educated about newborn behavior, STS, I&O, cluster feeding, supply and demand. WH/LC brochure given w/resources, support groups and LC services.  Patient Name: Pamela Pamela BoucheKacy Gamble AVWUJ'WToday's Date: 06/22/2016 Reason for consult: Initial assessment   Maternal Data Has patient been taught Hand Expression?: Yes Does the patient have breastfeeding experience prior to this delivery?: Yes  Feeding Feeding Type: Breast Fed Length of feed: 10 min  LATCH Score/Interventions Latch: Too sleepy or reluctant, no latch achieved, no sucking elicited. Intervention(s): Teach feeding cues;Waking techniques;Skin to skin Intervention(s): Adjust position;Assist with latch;Breast massage;Breast compression  Audible Swallowing: None Intervention(s): Skin to skin;Hand expression Intervention(s): Alternate breast massage  Type of Nipple: Everted at rest and after stimulation  Comfort (Breast/Nipple): Soft / non-tender     Hold (Positioning): Assistance needed to correctly position infant at breast and maintain latch. Intervention(s): Breastfeeding basics reviewed;Support Pillows;Position options;Skin to skin  LATCH Score: 5  Lactation Tools  Discussed/Used Tools: Nipple Shields Nipple shield size: 16 WIC Program: No   Consult Status Consult Status: Follow-up Date: 06/22/16 Follow-up type: In-patient    Charyl DancerCARVER, Joahan Swatzell G 06/22/2016, 5:32 AM

## 2016-06-22 NOTE — Progress Notes (Signed)
Post Partum Day 1 Subjective: no complaints, up ad lib, voiding and tolerating PO  Objective: Blood pressure (!) 92/49, pulse 61, temperature 97.8 F (36.6 C), temperature source Oral, resp. rate 16, height 5\' 4"  (1.626 m), weight 162 lb (73.5 kg), last menstrual period 09/18/2015, SpO2 98 %, unknown if currently breastfeeding.  Physical Exam:  General: alert and cooperative Lochia: appropriate Uterine Fundus: firm Incision: healing well DVT Evaluation: No evidence of DVT seen on physical exam. Negative Homan's sign. No cords or calf tenderness. No significant calf/ankle edema.   Recent Labs  06/21/16 0807 06/22/16 0515  HGB 11.3* 10.2*  HCT 32.9* 29.8*    Assessment/Plan: Plan for discharge tomorrow and Circumcision prior to discharge   LOS: 1 day   Dyshaun Bonzo G 06/22/2016, 8:11 AM

## 2016-06-22 NOTE — Discharge Summary (Signed)
Obstetric Discharge Summary Reason for Admission: onset of labor Prenatal Procedures: ultrasound Intrapartum Procedures: spontaneous vaginal delivery Postpartum Procedures: none Complications-Operative and Postpartum: 1 degree perineal laceration Hemoglobin  Date Value Ref Range Status  06/22/2016 10.2 (L) 12.0 - 15.0 g/dL Final   HCT  Date Value Ref Range Status  06/22/2016 29.8 (L) 36.0 - 46.0 % Final    Physical Exam:  General: alert and cooperative Lochia: appropriate Uterine Fundus: firm Incision: healing well DVT Evaluation: No evidence of DVT seen on physical exam. Negative Homan's sign. No cords or calf tenderness. No significant calf/ankle edema.  Discharge Diagnoses: Term Pregnancy-delivered  Discharge Information: Date: 06/22/2016 Activity: pelvic rest Diet: routine Medications: PNV and Ibuprofen Condition: stable Instructions: refer to practice specific booklet Discharge to: home   Newborn Data: Live born female  Birth Weight: 8 lb 8.9 oz (3880 g) APGAR: 9, 9  Home with mother.  Victory Dresden G 06/22/2016, 8:32 AM

## 2017-05-29 ENCOUNTER — Other Ambulatory Visit: Payer: Self-pay | Admitting: General Surgery

## 2017-05-29 MED ORDER — VALACYCLOVIR HCL 500 MG PO TABS: 500.0000 mg | ORAL_TABLET | Freq: Every day | ORAL | 0 refills | Status: AC

## 2020-01-23 ENCOUNTER — Ambulatory Visit: Payer: Self-pay

## 2020-01-23 ENCOUNTER — Ambulatory Visit (INDEPENDENT_AMBULATORY_CARE_PROVIDER_SITE_OTHER): Payer: BC Managed Care – PPO | Admitting: Orthopaedic Surgery

## 2020-01-23 ENCOUNTER — Encounter: Payer: Self-pay | Admitting: Orthopaedic Surgery

## 2020-01-23 ENCOUNTER — Other Ambulatory Visit: Payer: Self-pay

## 2020-01-23 DIAGNOSIS — M25572 Pain in left ankle and joints of left foot: Secondary | ICD-10-CM

## 2020-01-23 DIAGNOSIS — M25532 Pain in left wrist: Secondary | ICD-10-CM | POA: Diagnosis not present

## 2020-01-23 DIAGNOSIS — IMO0001 Reserved for inherently not codable concepts without codable children: Secondary | ICD-10-CM

## 2020-01-23 DIAGNOSIS — S93402A Sprain of unspecified ligament of left ankle, initial encounter: Secondary | ICD-10-CM | POA: Diagnosis not present

## 2020-01-23 MED ORDER — TRAMADOL HCL 50 MG PO TABS: 50.0000 mg | ORAL_TABLET | Freq: Four times a day (QID) | ORAL | 0 refills | Status: DC | PRN

## 2020-01-23 NOTE — Progress Notes (Signed)
Office Visit Note   Patient: Pamela Gamble           Date of Birth: [birth year]           MRN: [id number] Visit Date: 01/23/2020              Requested by: No referring provider defined for this encounter. PCP: Patient, No Pcp Per   Assessment & Plan: Visit Diagnoses:  1. Pain in left wrist   2. Pain in left ankle and joints of left foot   3. Grade 3 ankle sprain, left, initial encounter     Plan: She does have quite a severe ankle sprain.  I would like to place her in a cam walking boot today and allow weightbearing as tolerated which she understands as comfort allows.  She will ice and elevate her ankle today and can try 600 800 mg over-the-counter ibuprofen for inflammation and pain.  If she needs something stronger she will let me know.  We will hold off on any type of bracing of her left wrist because her pain is minimal with the wrist.  All questions and concerns were answered and addressed.  I would like to see her back for repeat exam in 2 weeks from now.  If she still having significant ankle issues, we can repeat 3 views of her left ankle.  Follow-Up Instructions: Return in about 2 weeks (around 02/06/2020).   Orders:  Orders Placed This Encounter  Procedures  . XR Wrist Complete Left  . XR Ankle Complete Left   No orders of the defined types were placed in this encounter.     Procedures: No procedures performed   Clinical Data: No additional findings.   Subjective: Chief Complaint  Patient presents with  . Left Wrist - Pain  . 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  She landed on outstretched left wrist as well as twisted her left ankle.  She reports significant left ankle pain and pain with weightbearing.  She also has significant left wrist pain.  She denies any  numbness and tingling in her hand or wrist.  She has a remote history of a cyst that did fracture of her fifth metacarpal on the left hand.  She is never had any ankle injuries or issues before with her left ankle.  She has no acute and active medical issues otherwise and was a very healthy individual.  She is now diabetic and not a smoker.  HPI  Review of Systems There is currently no chest pain, headache, shortness of breath, fever, chills, nausea, vomiting  Objective: Vital Signs: There were no vitals taken for this visit.  Physical Exam She is alert and orient x3 and in no acute distress Ortho Exam Examination of her left wrist she has a little bit of prominence of the Pisa form bone but no abnormalities and a flex and extend the wrist.  There is slight clicking in the wrist joint itself but it is not painful.  Pronation and supination of the left wrist are full and she is neurovascularly intact with normal pinch and grip strength.  Examination of her left ankle shows significant soft tissue swelling over the lateral malleolus.  There is pain over the anterior talofibular ligament and posterior to  the lateral malleolus.  There is no significant pain over the deltoid medially.  Her ankle is painful throughout its arc of motion but is located and neurovascularly intact. Specialty Comments:  No specialty comments available.  Imaging: XR Ankle Complete Left  Result Date: 01/23/2020 3 views of the left ankle show no obvious fracture.  There is significant lateral soft tissue swelling.  The ankle mortise is congruent and intact.  XR Wrist Complete Left  Result Date: 01/23/2020 3 views of the left wrist show no acute findings.  There is evidence of an old cystic change of the fracture to the fifth metacarpal shaft.  There is no worrisome features of this area.  The patient does have a history of a fracture through a cystic area of her fifth metacarpal.  The wrist joint itself appears  normal.    PMFS History: Patient Active Problem List   Diagnosis Date Noted  . Active labor at term 06/21/2016  . NSVD (normal spontaneous vaginal delivery) 06/21/2016  . Normal pregnancy 11/05/2014   Past Medical History:  Diagnosis Date  . Anxiety   . Kidney stones     Family History  Problem Relation Age of Onset  . Hypertension Mother   . Cancer Father   . Cancer Paternal Uncle   . Hypertension Paternal Grandmother     Past Surgical History:  Procedure Laterality Date  . FACIAL LACERATIONS REPAIR    . INGUINAL HERNIA REPAIR     Social History   Occupational History  . Not on file  Tobacco Use  . Smoking status: Never Smoker  . Smokeless tobacco: Never Used  Substance and Sexual Activity  . Alcohol use: No  . Drug use: No  . Sexual activity: Yes    Birth control/protection: None

## 2020-01-23 NOTE — Addendum Note (Signed)
Addended by: Doneen Poisson on: 01/23/2020 10:28 AM   Modules accepted: Orders

## 2020-02-06 ENCOUNTER — Ambulatory Visit (INDEPENDENT_AMBULATORY_CARE_PROVIDER_SITE_OTHER): Payer: BC Managed Care – PPO | Admitting: Orthopaedic Surgery

## 2020-02-06 ENCOUNTER — Ambulatory Visit: Payer: Self-pay

## 2020-02-06 ENCOUNTER — Encounter: Payer: Self-pay | Admitting: Orthopaedic Surgery

## 2020-02-06 ENCOUNTER — Other Ambulatory Visit: Payer: Self-pay

## 2020-02-06 DIAGNOSIS — S8265XA Nondisplaced fracture of lateral malleolus of left fibula, initial encounter for closed fracture: Secondary | ICD-10-CM | POA: Diagnosis not present

## 2020-02-06 DIAGNOSIS — IMO0001 Reserved for inherently not codable concepts without codable children: Secondary | ICD-10-CM

## 2020-02-06 DIAGNOSIS — S93402A Sprain of unspecified ligament of left ankle, initial encounter: Secondary | ICD-10-CM

## 2020-02-06 NOTE — Progress Notes (Signed)
Pamela Gamble comes in today for repeat x-rays after sustaining a significant twisting injury to her left ankle when her dog clipped her running hard.  She is in a cam walking boot with weightbearing as tolerated.  She has had a significant amount of pain the first week and is still painful but she can walk on it in her cam walking boot.  On examination of her left ankle, the swelling has gone down significantly.  She does have pain medially and over the lateral malleolus.  Her ankle motion is full and ankle feels ligamentously stable.  The bruising has dissipated significantly.  3 views of her left ankle are obtained and reviewed the previous films.  Now fracture is definitely seen there is a stable fracture lateral malleolus at the level ankle mortise.  It is nondisplaced.  There is a small cortical irregularity of the tip of the medial malleolus suggesting ligamentous injury to the deltoid ligament.  I reassured her this is a stable fracture pattern and the treatment is still weightbearing as tolerated in a functional fracture brace or walking boot.  Given the fracture though, we will have to slow down her from participating a type of high impact aerobic activities for least another 4 to 6 weeks.  I would like to see her back in 4 weeks from now with a repeat 3 views of the left ankle.  We will transition her to an ASO at that visit.  All questions and concerns were answered and addressed.

## 2020-03-07 ENCOUNTER — Other Ambulatory Visit: Payer: Self-pay

## 2020-03-07 ENCOUNTER — Ambulatory Visit: Payer: Self-pay

## 2020-03-07 ENCOUNTER — Ambulatory Visit (INDEPENDENT_AMBULATORY_CARE_PROVIDER_SITE_OTHER): Payer: BC Managed Care – PPO | Admitting: Orthopaedic Surgery

## 2020-03-07 DIAGNOSIS — S93402A Sprain of unspecified ligament of left ankle, initial encounter: Secondary | ICD-10-CM

## 2020-03-07 DIAGNOSIS — S93402D Sprain of unspecified ligament of left ankle, subsequent encounter: Secondary | ICD-10-CM

## 2020-03-07 DIAGNOSIS — IMO0001 Reserved for inherently not codable concepts without codable children: Secondary | ICD-10-CM

## 2020-03-07 DIAGNOSIS — M25562 Pain in left knee: Secondary | ICD-10-CM | POA: Diagnosis not present

## 2020-03-07 DIAGNOSIS — S8265XA Nondisplaced fracture of lateral malleolus of left fibula, initial encounter for closed fracture: Secondary | ICD-10-CM

## 2020-03-07 NOTE — Progress Notes (Signed)
Pamela Gamble comes in today about 6 weeks out from an injury to her left lower extremity.  Her dog ran into her hard barreling down a hill.  She sustained a nondisplaced lateral malleolus fracture of her left ankle.  She also had a contusion to her left knee.  Her left knee is still very tender over the tibial tubercle areas where she points to.  The ankles been doing well.  We have had her in a cam walking boot with weightbearing as tolerated.  On examination of her left knee she is very tender over the tibial tubercle.  Her extensor mechanism is intact and strong.  The knee is otherwise ligamentously stable with no effusion.  2 views left knee show no obvious fracture.  Examination of her left ankle shows some swelling and pain over the lateral malleolus but is much more tolerable than before.  Her range of motion is full in the ankles ligamentously stable.  There is no swelling of the ankle joint itself.  3 views left ankle are obtained and the fracture lines are still visible of the lateral malleolus at the level of the ankle mortise but there is callus formation and no malalignment.  This point we will transition her to an ASO of her left ankle.  She can weight-bear as tolerated will still avoid high impact aerobic activities.  She will try some Voltaren gel for her tibial tubercle area of the left knee.  We will see her back for potentially final visit in about 4 weeks with a repeat mortise view only of the left ankle.

## 2020-04-04 ENCOUNTER — Ambulatory Visit (INDEPENDENT_AMBULATORY_CARE_PROVIDER_SITE_OTHER): Payer: BC Managed Care – PPO

## 2020-04-04 ENCOUNTER — Ambulatory Visit (INDEPENDENT_AMBULATORY_CARE_PROVIDER_SITE_OTHER): Payer: BC Managed Care – PPO | Admitting: Orthopaedic Surgery

## 2020-04-04 ENCOUNTER — Encounter: Payer: Self-pay | Admitting: Orthopaedic Surgery

## 2020-04-04 ENCOUNTER — Other Ambulatory Visit: Payer: Self-pay

## 2020-04-04 DIAGNOSIS — S93402D Sprain of unspecified ligament of left ankle, subsequent encounter: Secondary | ICD-10-CM

## 2020-04-04 DIAGNOSIS — IMO0001 Reserved for inherently not codable concepts without codable children: Secondary | ICD-10-CM

## 2020-04-04 NOTE — Progress Notes (Signed)
Shenice is following up about 10 weeks after an injury occurred to her left knee and left ankle after the family dog was running down a hill and clipped her leg.  She ended up sustaining a nondisplaced lateral malleolus fracture.  We will treat her first in a cam walking boot and now she wears the ASO only when she is doing her exercise walking.  She says the ankle is really not hurting her bad and says a little bit of soreness after walking.  Her left knee is asymptomatic at this point.  On examination of the left ankle there is still some swelling of the lateral malleolus but her range of motion is full in the ankles ligamentously stable.  Her pain is minimal.  Single mortise view of the left ankle was obtained and compared to previous films.  There is abundant callus formation around the fracture itself and the ankle mortise is congruent.  This point she will continue increase her activities as comfort allows.  I will have her wear the ASO during activities for another 4 to 6 weeks and then she can stop that.  Follow-up at this point can be as needed.  All questions and concerns were answered addressed.  She did let me know about a remote fracture of her fifth metacarpal of her left hand that occurred through some type of bone cyst in 2009.  So far it is asymptomatic.  If that becomes an issue with her at all she knows to let us know would see her with new x-rays.

## 2020-06-11 DIAGNOSIS — Z1231 Encounter for screening mammogram for malignant neoplasm of breast: Secondary | ICD-10-CM | POA: Diagnosis not present

## 2020-06-11 DIAGNOSIS — Z01419 Encounter for gynecological examination (general) (routine) without abnormal findings: Secondary | ICD-10-CM | POA: Diagnosis not present

## 2020-06-11 DIAGNOSIS — Z6829 Body mass index (BMI) 29.0-29.9, adult: Secondary | ICD-10-CM | POA: Diagnosis not present

## 2020-10-16 ENCOUNTER — Ambulatory Visit: Payer: Self-pay

## 2020-10-16 ENCOUNTER — Other Ambulatory Visit: Payer: Self-pay

## 2020-10-16 ENCOUNTER — Ambulatory Visit (INDEPENDENT_AMBULATORY_CARE_PROVIDER_SITE_OTHER): Payer: BC Managed Care – PPO | Admitting: Orthopaedic Surgery

## 2020-10-16 DIAGNOSIS — M25562 Pain in left knee: Secondary | ICD-10-CM

## 2020-10-16 DIAGNOSIS — M25462 Effusion, left knee: Secondary | ICD-10-CM | POA: Diagnosis not present

## 2020-10-16 MED ORDER — METHYLPREDNISOLONE ACETATE 40 MG/ML IJ SUSP
40.0000 mg | INTRAMUSCULAR | Status: AC | PRN
  Administered 2020-10-16: 40 mg via INTRA_ARTICULAR

## 2020-10-16 MED ORDER — LIDOCAINE HCL 1 % IJ SOLN
3.0000 mL | INTRAMUSCULAR | Status: AC | PRN
  Administered 2020-10-16: 3 mL

## 2020-10-16 NOTE — Progress Notes (Signed)
Office Visit Note   Patient: Pamela Gamble           Date of Birth: 03/25/1981           MRN: [id number] Visit Date: 10/16/2020              Requested by: No referring provider defined for this encounter. PCP: Patient, No Pcp Per   Assessment & Plan: Visit Diagnoses:  1. Acute pain of left knee   2. Effusion of left knee joint     Plan: I am quite concerned that she has torn her ACL given the 100 cc of blood/hemarthrosis that I was able to aspirate from her knee.  We will put her in a hinged knee brace and she will continue her crutches.  She did feel a lot better after the aspiration.  My concern is that there was definitely play in her knee with a positive Lachman's exam indicative of a possible ACL tear.  Given the large hemarthrosis and instability on my exam and her clinical findings, a MRI of this left knee is warranted to rule out an internal derangement such as a complete ACL tear.  Once we have that study we will make further treatment recommendations.  I have recommended that she work on flexion and extension of her left knee and taking ibuprofen as needed for pain.  She will still work on intermittently icing the knee and can even take a baby aspirin twice a day and work on pumping her foot and ankle to avoid a DVT.  All question and concerns were answered and addressed.  Once we have the MRI results of her left knee we will go from there.  Follow-Up Instructions: No follow-ups on file.   Orders:  Orders Placed This Encounter  Procedures  . XR Knee 1-2 Views Left   No orders of the defined types were placed in this encounter.     Procedures: Large Joint Inj: L knee on 10/16/2020 10:59 AM Indications: diagnostic evaluation and pain Details: 22 G 1.5 in needle, superolateral approach  Arthrogram: No  Medications: 3 mL lidocaine 1 %; 40 mg methylPREDNISolone acetate 40 MG/ML Outcome: tolerated well, no immediate complications Procedure, treatment alternatives, risks and  benefits explained, specific risks discussed. Consent was given by the patient. Immediately prior to procedure a time out was called to verify the correct patient, procedure, equipment, support staff and site/side marked as required. Patient was prepped and draped in the usual sterile fashion.       Clinical Data: No additional findings.   Subjective: Chief Complaint  Patient presents with  . Left Knee - Pain  Pamela Gamble is an active 40 year old female who [place] her left [place]  There was some ice and she had a twisting type of injury.  She then developed significant immediate swelling with difficulty ambulating.  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  He is with her today.  I know him well.  He said he was able to at least try to get an exam on her right away in terms of her drawer sign.  He felt there was potentially some play in the knee but it was difficult to tell.  She is on crutches now and has abundant swelling of her left knee.  She has never injured this knee before other than a contusion which I saw her for sometime last year.  She had no symptoms of instability  until this acute injury to her left knee 2 days ago.  HPI  Review of Systems There is no listed headache, chest pain, shortness of breath, fever, chills, nausea, vomiting  Objective: Vital Signs: There were no vitals taken for this visit.  Physical Exam She is alert and orient x3 and in no acute distress but obvious discomfort Ortho Exam Examination of her left knee shows a very large effusion.  I was able to aspirate almost 100 cc of blood from the knee showing a very large hemarthrosis.  After the aspiration I was able to get a ligamentous exam of her left knee.  The knee feels unstable to me with a positive Lachman's exam.  Her right knee has a normal exam.  She still hurts quite a bit with me trying to flex her past 90 degrees with her left knee. Specialty Comments:  No  specialty comments available.  Imaging: XR Knee 1-2 Views Left  Result Date: 10/16/2020 2 views of the left knee show no obvious fracture or malalignment.  There is a large effusion.  The alignment of the knee is well-maintained.    PMFS History: Patient Active Problem List   Diagnosis Date Noted  . Nondisplaced fracture of lateral malleolus of left fibula, initial encounter for closed fracture 02/06/2020  . Active labor at term 06/21/2016  . NSVD (normal spontaneous vaginal delivery) 06/21/2016  . Normal pregnancy 11/05/2014   Past Medical History:  Diagnosis Date  . Anxiety   . Kidney stones     Family History  Problem Relation Age of Onset  . Hypertension Mother   . Cancer Father   . Cancer Paternal Uncle   . Hypertension Paternal Grandmother     Past Surgical History:  Procedure Laterality Date  . FACIAL LACERATIONS REPAIR    . INGUINAL HERNIA REPAIR     Social History   Occupational History  . Not on file  Tobacco Use  . Smoking status: Never Smoker  . Smokeless tobacco: Never Used  Substance and Sexual Activity  . Alcohol use: No  . Drug use: No  . Sexual activity: Yes    Birth control/protection: None

## 2020-10-18 ENCOUNTER — Ambulatory Visit
Admission: RE | Admit: 2020-10-18 | Discharge: 2020-10-18 | Disposition: A | Discharge status: Home / Self Care | Payer: BC Managed Care – PPO | Source: Ambulatory Visit | Attending: Orthopaedic Surgery | Admitting: Orthopaedic Surgery

## 2020-10-18 ENCOUNTER — Other Ambulatory Visit: Payer: Self-pay

## 2020-10-18 DIAGNOSIS — M25562 Pain in left knee: Secondary | ICD-10-CM

## 2020-10-21 ENCOUNTER — Encounter (HOSPITAL_BASED_OUTPATIENT_CLINIC_OR_DEPARTMENT_OTHER): Payer: Self-pay | Admitting: Orthopedic Surgery

## 2020-10-21 ENCOUNTER — Other Ambulatory Visit: Payer: Self-pay

## 2020-10-21 ENCOUNTER — Ambulatory Visit (INDEPENDENT_AMBULATORY_CARE_PROVIDER_SITE_OTHER): Payer: BC Managed Care – PPO | Admitting: Orthopedic Surgery

## 2020-10-21 DIAGNOSIS — S82113A Displaced fracture of unspecified tibial spine, initial encounter for closed fracture: Secondary | ICD-10-CM

## 2020-10-22 ENCOUNTER — Other Ambulatory Visit (HOSPITAL_COMMUNITY): Payer: BC Managed Care – PPO

## 2020-10-22 ENCOUNTER — Encounter: Payer: Self-pay | Admitting: Orthopedic Surgery

## 2020-10-22 NOTE — Progress Notes (Signed)
Office Visit Note   Patient: Pamela Gamble           Date of Birth: Aug 27, 1981           MRN: [id number] Visit Date: 10/21/2020 Requested by: No referring provider defined for this encounter. PCP: Patient, No Pcp Per  Subjective: Chief Complaint  Patient presents with  . 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  She is an experienced skier but fell in noncontact  fashion and injured her left knee 7 days ago.  Aspiration of approximately 100 cc of blood was performed several days after the injury.  MRI scan demonstrates tibial eminence fracture with 4 to 5 mm of superior displacement with attachment of the anterior horn lateral meniscus on the fragment.  Nondisplaced tibial plateau fracture also present.  Unknown status of osteopenia/osteoporosis and vitamin D levels..  Patient has no personal or family history of DVT or pulmonary embolism.  She has been taking ibuprofen.  Has been touchdown weightbearing with crutches.  She enjoys walking hiking and skiing for recreational activity.  She does have small children at home.              ROS: All systems reviewed are negative as they relate to the chief complaint within the history of present illness.  Patient denies  fevers or chills.   Assessment & Plan: Visit Diagnoses:  1. Displaced fracture of unspecified tibial spine, initial encounter for closed fracture     Plan: Impression is displaced tibial eminence fracture with some instability on initial examination of the knee today.  Hard to get a great exam because of pain.  Mild effusion has recurred.  Collateral ligaments feel stable.  Patient is able to achieve full extension and flexion to about 80 degrees.  Based on the amount of laxity she has on somewhat guarded exam today I think it is reasonable with the amount of displacement to consider operative intervention which would be  suture fixation of that fragment.  I think is also possible that she could have underlying vitamin D deficiency and osteopenia based on the fracture pattern.  This is something we can work-up concurrent with the treatment of this injury.  At the time of this dictation the patient has been taking a weight loss supplement which may impact her ability to undergo surgery within the optimal time window.  Surgical plan at this time is for suture fixation of the fragment with possible augmentation with screw fixation which would be below the joint surface.  That would really depend on the type of reduction we get but if done this week I think it is high likelihood we could achieve good reduction and stable reduction with suture fixation.  Surgical delay would potentially mean that they would be more likely to require open reduction in order to achieve fracture fixation of the fragment and reduced manner which would increase her knee stability for the future.  Risk and benefits of surgery discussed include not limited to infection nerve vessel damage knee stiffness loss of reduction and possible need for further surgery.  Expected rehabilitation time also discussed.  Plan to draw vitamin D levels in the perioperative period as well as assess bone density at the time of surgery as best possible.  I think fracture fixation would optimize her knee stability moving forward and would also anchor the anterior horn of the lateral  meniscus as well.  If surgical fixation for what ever reason is not possible due to other factors I think the patient could heal the fracture and have some modicum of stability to the knee.  For now we will keep the knee in extension to Prevent fracture displacement from inhibiting full extension.  Touchdown weightbearing for transfers only due to the lateral tibial plateau fracture.    Follow-Up Instructions: No follow-ups on file.   Orders:  No orders of the defined types were placed in this  encounter.  No orders of the defined types were placed in this encounter.     Procedures: No procedures performed   Clinical Data: No additional findings.  Objective: Vital Signs: There were no vitals taken for this visit.  Physical Exam:   Constitutional: Patient appears well-developed HEENT:  Head: Normocephalic Eyes:EOM are normal Neck: Normal range of motion Cardiovascular: Normal rate Pulmonary/chest: Effort normal Neurologic: Patient is alert Skin: Skin is warm Psychiatric: Patient has normal mood and affect   Ortho Exam: Ortho exam demonstrates mild effusion left knee.  No calf tenderness negative Homans.  Pedal pulses palpable.  Ankle dorsiflexion intact.  Collaterals are stable.  She does have increased laxity 2 to 3 mm more on anterior drawer testing left versus right but I could really only get 1 marginally good exam due to pain.  It is possible she may have more laxity than can be appreciated with awake exam.  No real joint line tenderness is present.  She can flex to about 80 degrees.  Specialty Comments:  No specialty comments available.  Imaging: No results found.   PMFS History: Patient Active Problem List   Diagnosis Date Noted  . Nondisplaced fracture of lateral malleolus of left fibula, initial encounter for closed fracture 02/06/2020  . Active labor at term 06/21/2016  . NSVD (normal spontaneous vaginal delivery) 06/21/2016  . Normal pregnancy 11/05/2014   Past Medical History:  Diagnosis Date  . Anxiety   . Kidney stones     Family History  Problem Relation Age of Onset  . Hypertension Mother   . Cancer Father   . Cancer Paternal Uncle   . Hypertension Paternal Grandmother     Past Surgical History:  Procedure Laterality Date  . FACIAL LACERATIONS REPAIR    . INGUINAL HERNIA REPAIR     Social History   Occupational History  . Not on file  Tobacco Use  . Smoking status: Never Smoker  . Smokeless tobacco: Never Used  Vaping Use   . Vaping Use: Never used  Substance and Sexual Activity  . Alcohol use: No  . Drug use: No  . Sexual activity: Yes    Birth control/protection: I.U.D.

## 2020-10-24 ENCOUNTER — Ambulatory Visit (HOSPITAL_BASED_OUTPATIENT_CLINIC_OR_DEPARTMENT_OTHER)
Admission: RE | Admit: 2020-10-24 | Payer: BC Managed Care – PPO | Source: Home / Self Care | Admitting: Orthopedic Surgery

## 2020-10-24 SURGERY — OPEN REDUCTION INTERNAL FIXATION (ORIF) TIBIAL PLATEAU
Anesthesia: Choice | Laterality: Left

## 2020-10-27 ENCOUNTER — Other Ambulatory Visit: Payer: BC Managed Care – PPO

## 2020-10-28 ENCOUNTER — Other Ambulatory Visit: Payer: Self-pay | Admitting: Surgical

## 2020-10-28 ENCOUNTER — Encounter: Payer: Self-pay | Admitting: Orthopedic Surgery

## 2020-10-28 DIAGNOSIS — S82112A Displaced fracture of left tibial spine, initial encounter for closed fracture: Secondary | ICD-10-CM | POA: Diagnosis not present

## 2020-10-28 DIAGNOSIS — X58XXXA Exposure to other specified factors, initial encounter: Secondary | ICD-10-CM | POA: Diagnosis not present

## 2020-10-28 DIAGNOSIS — Y9323 Activity, snow (alpine) (downhill) skiing, snow boarding, sledding, tobogganing and snow tubing: Secondary | ICD-10-CM | POA: Diagnosis not present

## 2020-10-28 DIAGNOSIS — G8918 Other acute postprocedural pain: Secondary | ICD-10-CM | POA: Diagnosis not present

## 2020-10-28 MED ORDER — ONDANSETRON HCL 4 MG PO TABS: 4.0000 mg | ORAL_TABLET | Freq: Three times a day (TID) | ORAL | 0 refills | Status: AC | PRN

## 2020-10-28 MED ORDER — OXYCODONE-ACETAMINOPHEN 5-325 MG PO TABS: 1.0000 | ORAL_TABLET | ORAL | 0 refills | Status: AC | PRN

## 2020-10-28 MED ORDER — METHOCARBAMOL 500 MG PO TABS: 500.0000 mg | ORAL_TABLET | Freq: Three times a day (TID) | ORAL | 0 refills | Status: DC | PRN

## 2020-10-28 MED ORDER — KETOROLAC TROMETHAMINE 10 MG PO TABS: 10.0000 mg | ORAL_TABLET | Freq: Three times a day (TID) | ORAL | 0 refills | Status: DC | PRN

## 2020-10-31 ENCOUNTER — Inpatient Hospital Stay: Payer: BC Managed Care – PPO | Admitting: Orthopedic Surgery

## 2020-11-04 ENCOUNTER — Encounter: Payer: Self-pay | Admitting: Surgical

## 2020-11-04 ENCOUNTER — Ambulatory Visit (INDEPENDENT_AMBULATORY_CARE_PROVIDER_SITE_OTHER): Payer: BC Managed Care – PPO

## 2020-11-04 ENCOUNTER — Ambulatory Visit (INDEPENDENT_AMBULATORY_CARE_PROVIDER_SITE_OTHER): Payer: BC Managed Care – PPO | Admitting: Surgical

## 2020-11-04 DIAGNOSIS — S82113A Displaced fracture of unspecified tibial spine, initial encounter for closed fracture: Secondary | ICD-10-CM | POA: Diagnosis not present

## 2020-11-04 DIAGNOSIS — S82112A Displaced fracture of left tibial spine, initial encounter for closed fracture: Secondary | ICD-10-CM

## 2020-11-04 DIAGNOSIS — E559 Vitamin D deficiency, unspecified: Secondary | ICD-10-CM | POA: Diagnosis not present

## 2020-11-04 DIAGNOSIS — M859 Disorder of bone density and structure, unspecified: Secondary | ICD-10-CM | POA: Diagnosis not present

## 2020-11-04 MED ORDER — MELOXICAM 15 MG PO TABS: 15.0000 mg | ORAL_TABLET | Freq: Every day | ORAL | 0 refills | Status: AC | PRN

## 2020-11-04 NOTE — Progress Notes (Signed)
Post-Op Visit Note   Patient: Pamela Gamble           Date of Birth: July 19, 1981           MRN: [id number] Visit Date: 11/04/2020 PCP: Patient, No Pcp Per   Assessment & Plan:  Chief Complaint:  Chief Complaint  Patient presents with  . Left Knee - Follow-up, Routine Post Op   Visit Diagnoses:  1. Displaced fracture of unspecified tibial spine, initial encounter for closed fracture     Plan: Patient is a 40 year old female who presents s/p left knee ACL tibial eminence avulsion fracture fixation on 10/28/2020.  She is doing well.  Pain is improving.  She only had to take oxycodone for approximately 72 hours following surgery.  She has finished the course of Toradol and is now just taking ibuprofen as needed.  She is ambulating with crutches and nonweightbearing on the operative extremity.  She does note occasional GI upset but overall this is improving.  Radiographs taken today show tibial eminence in excellent position.  Reviewed the intraoperative images with the patient and her husband.  Due to the somewhat porous nature of her bone and the decreased bone quality that was noted at time of surgery, plan to order DEXA scan as well as obtain lab work today consisting of vitamin D, calcium, phosphorus, TSH.  Sutures were removed today.  Incisions are healing well with no evidence of infection or dehiscence.  Steri-Strips applied.  No calf tenderness.  Negative Homans' sign.  Small trace effusion noted on exam.  She is able to perform one straight leg raise weakly.  She is being set up for CPM machine and will use the machine for 1 hour 1 time per day and increase range of motion of the machine until she is at 90 degrees 2 weeks from now.  Overall she is doing well and will follow up in 2 weeks for clinical recheck with Dr. Pain.  Follow-Up Instructions: No follow-ups on file.   Orders:  Orders Placed This Encounter  Procedures  . XR KNEE 3 VIEW LEFT  . DG BONE DENSITY (DXA)  . Calcium  .  TSH  . Vitamin D 1,25 dihydroxy  . VITAMIN D 25 Hydroxy (Vit-D Deficiency, Fractures)  . Phosphorus   Meds ordered this encounter  Medications  . meloxicam (MOBIC) 15 MG tablet    Sig: Take 1 tablet (15 mg total) by mouth daily as needed for pain.    Dispense:  30 tablet    Refill:  0    Imaging: No results found.  PMFS History: Patient Active Problem List   Diagnosis Date Noted  . Nondisplaced fracture of lateral malleolus of left fibula, initial encounter for closed fracture 02/06/2020  . Active labor at term 06/21/2016  . NSVD (normal spontaneous vaginal delivery) 06/21/2016  . Normal pregnancy 11/05/2014   Past Medical History:  Diagnosis Date  . Anxiety   . Kidney stones     Family History  Problem Relation Age of Onset  . Hypertension Mother   . Cancer Father   . Cancer Paternal Uncle   . Hypertension Paternal Grandmother     Past Surgical History:  Procedure Laterality Date  . FACIAL LACERATIONS REPAIR    . INGUINAL HERNIA REPAIR     Social History   Occupational History  . Not on file  Tobacco Use  . Smoking status: Never Smoker  . Smokeless tobacco: Never Used  Vaping Use  . Vaping Use: Never  used  Substance and Sexual Activity  . Alcohol use: No  . Drug use: No  . Sexual activity: Yes    Birth control/protection: I.U.D.

## 2020-11-06 ENCOUNTER — Inpatient Hospital Stay: Admission: RE | Admit: 2020-11-06 | Payer: BC Managed Care – PPO | Source: Ambulatory Visit

## 2020-11-06 ENCOUNTER — Other Ambulatory Visit: Payer: Self-pay | Admitting: Surgical

## 2020-11-06 DIAGNOSIS — S82113A Displaced fracture of unspecified tibial spine, initial encounter for closed fracture: Secondary | ICD-10-CM

## 2020-11-07 ENCOUNTER — Other Ambulatory Visit: Payer: Self-pay

## 2020-11-07 ENCOUNTER — Ambulatory Visit
Admission: RE | Admit: 2020-11-07 | Discharge: 2020-11-07 | Disposition: A | Discharge status: Home / Self Care | Payer: BC Managed Care – PPO | Source: Ambulatory Visit | Attending: Surgical | Admitting: Surgical

## 2020-11-07 DIAGNOSIS — Z13820 Encounter for screening for osteoporosis: Secondary | ICD-10-CM | POA: Diagnosis not present

## 2020-11-07 DIAGNOSIS — S82113A Displaced fracture of unspecified tibial spine, initial encounter for closed fracture: Secondary | ICD-10-CM

## 2020-11-07 LAB — EXTRA LAV TOP TUBE

## 2020-11-07 LAB — VITAMIN D 1,25 DIHYDROXY
Vitamin D 1, 25 (OH)2 Total: 30 pg/mL (ref 18–72)
Vitamin D2 1, 25 (OH)2: <8 pg/mL
Vitamin D3 1, 25 (OH)2: 30 pg/mL

## 2020-11-07 LAB — VITAMIN D 25 HYDROXY (VIT D DEFICIENCY, FRACTURES): Vit D, 25-Hydroxy: 27 ng/mL — ABNORMAL LOW (ref 30–100)

## 2020-11-07 LAB — CALCIUM: Calcium: 9.3 mg/dL (ref 8.6–10.2)

## 2020-11-07 LAB — PHOSPHORUS: Phosphorus: 3.2 mg/dL (ref 2.5–4.5)

## 2020-11-07 LAB — TSH: TSH: 3.12 mIU/L

## 2020-11-10 NOTE — Progress Notes (Signed)
thx

## 2020-11-10 NOTE — Progress Notes (Signed)
I called her about this

## 2020-11-18 ENCOUNTER — Ambulatory Visit (INDEPENDENT_AMBULATORY_CARE_PROVIDER_SITE_OTHER): Payer: BC Managed Care – PPO | Admitting: Orthopedic Surgery

## 2020-11-18 ENCOUNTER — Other Ambulatory Visit: Payer: Self-pay

## 2020-11-18 DIAGNOSIS — S82113A Displaced fracture of unspecified tibial spine, initial encounter for closed fracture: Secondary | ICD-10-CM

## 2020-11-19 ENCOUNTER — Encounter: Payer: Self-pay | Admitting: Orthopedic Surgery

## 2020-11-19 NOTE — Progress Notes (Signed)
   Post-Op Visit Note   Patient: Pamela Gamble           Date of Birth: 05-19-1981           MRN: [id number] Visit Date: 11/18/2020 PCP: Patient, No Pcp Per   Assessment & Plan:  Chief Complaint:  Chief Complaint  Patient presents with  . Other     Post op follow up   Visit Diagnoses:  1. Displaced fracture of unspecified tibial spine, initial encounter for closed fracture     Plan: Suzzette is now 3 weeks postop left knee tibial eminence fracture fixation.  CPM machine is at 90.  She is doing better.  Taking Mobic as needed.  She has been nonweightbearing.  She has been doing some virtual clinic visits.  On examination she can bend to 90 but it is not an easy motion for her.  She does have full extension.  Mild effusion present.  No calf tenderness today.  Plan at this time is touchdown weightbearing for a week and partial weightbearing for a week.  2-week return with repeat radiographs at that time and then we can progress her to full weightbearing.  Patient and her husband have a trip planned to [place] in 3 weeks.  I do want to discontinue the knee immobilizer as she can do 10 straight leg raises easily at this time.  Follow-Up Instructions: Return in about 2 weeks (around 12/02/2020).   Orders:  No orders of the defined types were placed in this encounter.  No orders of the defined types were placed in this encounter.   Imaging: No results found.  PMFS History: Patient Active Problem List   Diagnosis Date Noted  . Nondisplaced fracture of lateral malleolus of left fibula, initial encounter for closed fracture 02/06/2020  . Active labor at term 06/21/2016  . NSVD (normal spontaneous vaginal delivery) 06/21/2016  . Normal pregnancy 11/05/2014   Past Medical History:  Diagnosis Date  . Anxiety   . Kidney stones     Family History  Problem Relation Age of Onset  . Hypertension Mother   . Cancer Father   . Cancer Paternal Uncle   . Hypertension Paternal Grandmother      Past Surgical History:  Procedure Laterality Date  . FACIAL LACERATIONS REPAIR    . INGUINAL HERNIA REPAIR     Social History   Occupational History  . Not on file  Tobacco Use  . Smoking status: Never Smoker  . Smokeless tobacco: Never Used  Vaping Use  . Vaping Use: Never used  Substance and Sexual Activity  . Alcohol use: No  . Drug use: No  . Sexual activity: Yes    Birth control/protection: I.U.D.

## 2020-11-22 DIAGNOSIS — R635 Abnormal weight gain: Secondary | ICD-10-CM | POA: Diagnosis not present

## 2020-11-22 DIAGNOSIS — Z6825 Body mass index (BMI) 25.0-25.9, adult: Secondary | ICD-10-CM | POA: Diagnosis not present

## 2020-12-04 ENCOUNTER — Ambulatory Visit: Payer: BC Managed Care – PPO | Admitting: Orthopedic Surgery

## 2020-12-05 ENCOUNTER — Encounter: Payer: Self-pay | Admitting: Orthopedic Surgery

## 2020-12-05 ENCOUNTER — Ambulatory Visit (INDEPENDENT_AMBULATORY_CARE_PROVIDER_SITE_OTHER): Payer: Self-pay

## 2020-12-05 ENCOUNTER — Ambulatory Visit (INDEPENDENT_AMBULATORY_CARE_PROVIDER_SITE_OTHER): Payer: BC Managed Care – PPO | Admitting: Orthopedic Surgery

## 2020-12-05 DIAGNOSIS — S82113A Displaced fracture of unspecified tibial spine, initial encounter for closed fracture: Secondary | ICD-10-CM

## 2020-12-05 NOTE — Progress Notes (Signed)
   Post-Op Visit Note   Patient: Pamela Gamble           Date of Birth: 11-10-1980           MRN: [id number] Visit Date: 12/05/2020 PCP: Patient, No Pcp Per   Assessment & Plan:  Chief Complaint:  Chief Complaint  Patient presents with  . Left Knee - Routine Post Op   Visit Diagnoses:  1. Displaced fracture of unspecified tibial spine, initial encounter for closed fracture     Plan: Julienne is a 40 year old patient with left knee tibial eminence avulsion fracture fixation.  She will be 6 weeks out on Monday.  Has a trip to [place] coming up on Saturday.  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  Taking ibuprofen.  Denies any calf pain.  CPM is at 115.  She has been partial weightbearing.  On examination her flexion is easier to achieve 90 degrees today than it was in the prior visit.  Knee has trace effusion and great stability.  Expected quad atrophy is present.  Negative Homans no calf tenderness today.  Plan at this time is 4-week return and transition to weightbearing as tolerated without crutches.  Regarding her trip to the [place] I think that she will have to be careful with snorkeling and walking on the beach.  The fracture has essentially healed at this time but her quad strength and hamstring strength is likely diminished.  Recommend regimen of biking and rowing which is a closed chain exercise to strengthen the quad and hamstring muscles.  Come back in 4 weeks for clinical recheck.  Follow-Up Instructions: Return in about 4 weeks (around 01/02/2021).   Orders:  Orders Placed This Encounter  Procedures  . XR KNEE 3 VIEW LEFT   No orders of the defined types were placed in this encounter.   Imaging: XR KNEE 3 VIEW LEFT  Result Date: 12/05/2020 AP lateral merchant left knee reviewed.  Left knee tibial eminence avulsion fracture in good position alignment with no evidence of change in position.  No other fracture or bony  abnormality noted.   PMFS History: Patient Active Problem List   Diagnosis Date Noted  . Nondisplaced fracture of lateral malleolus of left fibula, initial encounter for closed fracture 02/06/2020  . Active labor at term 06/21/2016  . NSVD (normal spontaneous vaginal delivery) 06/21/2016  . Normal pregnancy 11/05/2014   Past Medical History:  Diagnosis Date  . Anxiety   . Kidney stones     Family History  Problem Relation Age of Onset  . Hypertension Mother   . Cancer Father   . Cancer Paternal Uncle   . Hypertension Paternal Grandmother     Past Surgical History:  Procedure Laterality Date  . FACIAL LACERATIONS REPAIR    . INGUINAL HERNIA REPAIR     Social History   Occupational History  . Not on file  Tobacco Use  . Smoking status: Never Smoker  . Smokeless tobacco: Never Used  Vaping Use  . Vaping Use: Never used  Substance and Sexual Activity  . Alcohol use: No  . Drug use: No  . Sexual activity: Yes    Birth control/protection: I.U.D.

## 2020-12-20 DIAGNOSIS — Z4789 Encounter for other orthopedic aftercare: Secondary | ICD-10-CM | POA: Diagnosis not present

## 2020-12-20 DIAGNOSIS — R262 Difficulty in walking, not elsewhere classified: Secondary | ICD-10-CM | POA: Diagnosis not present

## 2020-12-20 DIAGNOSIS — M25562 Pain in left knee: Secondary | ICD-10-CM | POA: Diagnosis not present

## 2020-12-30 ENCOUNTER — Ambulatory Visit (INDEPENDENT_AMBULATORY_CARE_PROVIDER_SITE_OTHER): Payer: BC Managed Care – PPO | Admitting: Orthopedic Surgery

## 2020-12-30 ENCOUNTER — Other Ambulatory Visit: Payer: Self-pay

## 2020-12-30 DIAGNOSIS — S82113A Displaced fracture of unspecified tibial spine, initial encounter for closed fracture: Secondary | ICD-10-CM

## 2020-12-31 DIAGNOSIS — M25562 Pain in left knee: Secondary | ICD-10-CM | POA: Diagnosis not present

## 2020-12-31 DIAGNOSIS — Z4789 Encounter for other orthopedic aftercare: Secondary | ICD-10-CM | POA: Diagnosis not present

## 2020-12-31 DIAGNOSIS — R262 Difficulty in walking, not elsewhere classified: Secondary | ICD-10-CM | POA: Diagnosis not present

## 2021-01-02 ENCOUNTER — Encounter: Payer: Self-pay | Admitting: Orthopedic Surgery

## 2021-01-02 NOTE — Progress Notes (Signed)
   Post-Op Visit Note   Patient: Pamela Gamble           Date of Birth: August 06, 1981           MRN: [id number] Visit Date: 12/30/2020 PCP: Patient, No Pcp Per (Inactive)   Assessment & Plan:  Chief Complaint:  Chief Complaint  Patient presents with  . Left Knee - Routine Post Op   Visit Diagnoses:  1. Displaced fracture of unspecified tibial spine, initial encounter for closed fracture     Plan: Pamela Gamble is a 40 year old patient who is now about 9 weeks out left tibial eminence avulsion fracture.  She is doing well.  She was able to go to [place] and did some snorkeling.  She has been walking around and has returned to work.  On exam she has good range of motion with near full extension to about 115 of flexion.  Quad atrophy is about slightly under 2 cm which is not unexpected.  Knee feels exceptionally stable.  No effusion.  Hiking is on the docket for the summer which I think she should be good to do.  She is doing home exercise program and physical therapy.  Plan is to continue to work on primarily quad strengthening.  Fracture healing complete at this time.  Follow-up as needed.  Would not pursue any cutting or pivoting activities for at least a couple of months until her strength gets closer to normal.  No calf tenderness negative Homans today.  Follow-Up Instructions: Return if symptoms worsen or fail to improve.   Orders:  No orders of the defined types were placed in this encounter.  No orders of the defined types were placed in this encounter.   Imaging: No results found.  PMFS History: Patient Active Problem List   Diagnosis Date Noted  . Nondisplaced fracture of lateral malleolus of left fibula, initial encounter for closed fracture 02/06/2020  . Active labor at term 06/21/2016  . NSVD (normal spontaneous vaginal delivery) 06/21/2016  . Normal pregnancy 11/05/2014   Past Medical History:  Diagnosis Date  . Anxiety   . Kidney stones     Family History  Problem  Relation Age of Onset  . Hypertension Mother   . Cancer Father   . Cancer Paternal Uncle   . Hypertension Paternal Grandmother     Past Surgical History:  Procedure Laterality Date  . FACIAL LACERATIONS REPAIR    . INGUINAL HERNIA REPAIR     Social History   Occupational History  . Not on file  Tobacco Use  . Smoking status: Never Smoker  . Smokeless tobacco: Never Used  Vaping Use  . Vaping Use: Never used  Substance and Sexual Activity  . Alcohol use: No  . Drug use: No  . Sexual activity: Yes    Birth control/protection: I.U.D.

## 2021-01-07 DIAGNOSIS — M25562 Pain in left knee: Secondary | ICD-10-CM | POA: Diagnosis not present

## 2021-01-07 DIAGNOSIS — Z4789 Encounter for other orthopedic aftercare: Secondary | ICD-10-CM | POA: Diagnosis not present

## 2021-01-07 DIAGNOSIS — R262 Difficulty in walking, not elsewhere classified: Secondary | ICD-10-CM | POA: Diagnosis not present

## 2021-01-10 DIAGNOSIS — Z4789 Encounter for other orthopedic aftercare: Secondary | ICD-10-CM | POA: Diagnosis not present

## 2021-01-10 DIAGNOSIS — M25562 Pain in left knee: Secondary | ICD-10-CM | POA: Diagnosis not present

## 2021-01-10 DIAGNOSIS — R262 Difficulty in walking, not elsewhere classified: Secondary | ICD-10-CM | POA: Diagnosis not present

## 2021-01-14 DIAGNOSIS — M25562 Pain in left knee: Secondary | ICD-10-CM | POA: Diagnosis not present

## 2021-01-14 DIAGNOSIS — Z4789 Encounter for other orthopedic aftercare: Secondary | ICD-10-CM | POA: Diagnosis not present

## 2021-01-14 DIAGNOSIS — R262 Difficulty in walking, not elsewhere classified: Secondary | ICD-10-CM | POA: Diagnosis not present

## 2021-01-21 DIAGNOSIS — R262 Difficulty in walking, not elsewhere classified: Secondary | ICD-10-CM | POA: Diagnosis not present

## 2021-01-21 DIAGNOSIS — Z4789 Encounter for other orthopedic aftercare: Secondary | ICD-10-CM | POA: Diagnosis not present

## 2021-01-21 DIAGNOSIS — M25562 Pain in left knee: Secondary | ICD-10-CM | POA: Diagnosis not present

## 2021-01-24 DIAGNOSIS — Z4789 Encounter for other orthopedic aftercare: Secondary | ICD-10-CM | POA: Diagnosis not present

## 2021-01-24 DIAGNOSIS — R262 Difficulty in walking, not elsewhere classified: Secondary | ICD-10-CM | POA: Diagnosis not present

## 2021-01-24 DIAGNOSIS — M25562 Pain in left knee: Secondary | ICD-10-CM | POA: Diagnosis not present

## 2021-01-28 DIAGNOSIS — R262 Difficulty in walking, not elsewhere classified: Secondary | ICD-10-CM | POA: Diagnosis not present

## 2021-01-28 DIAGNOSIS — Z4789 Encounter for other orthopedic aftercare: Secondary | ICD-10-CM | POA: Diagnosis not present

## 2021-01-28 DIAGNOSIS — M25562 Pain in left knee: Secondary | ICD-10-CM | POA: Diagnosis not present

## 2021-01-31 DIAGNOSIS — R262 Difficulty in walking, not elsewhere classified: Secondary | ICD-10-CM | POA: Diagnosis not present

## 2021-01-31 DIAGNOSIS — Z4789 Encounter for other orthopedic aftercare: Secondary | ICD-10-CM | POA: Diagnosis not present

## 2021-01-31 DIAGNOSIS — M25562 Pain in left knee: Secondary | ICD-10-CM | POA: Diagnosis not present

## 2021-02-04 DIAGNOSIS — R262 Difficulty in walking, not elsewhere classified: Secondary | ICD-10-CM | POA: Diagnosis not present

## 2021-02-04 DIAGNOSIS — Z4789 Encounter for other orthopedic aftercare: Secondary | ICD-10-CM | POA: Diagnosis not present

## 2021-02-04 DIAGNOSIS — M25562 Pain in left knee: Secondary | ICD-10-CM | POA: Diagnosis not present

## 2021-02-06 DIAGNOSIS — R262 Difficulty in walking, not elsewhere classified: Secondary | ICD-10-CM | POA: Diagnosis not present

## 2021-02-06 DIAGNOSIS — Z4789 Encounter for other orthopedic aftercare: Secondary | ICD-10-CM | POA: Diagnosis not present

## 2021-02-06 DIAGNOSIS — M25562 Pain in left knee: Secondary | ICD-10-CM | POA: Diagnosis not present

## 2021-02-11 DIAGNOSIS — M25562 Pain in left knee: Secondary | ICD-10-CM | POA: Diagnosis not present

## 2021-02-11 DIAGNOSIS — R262 Difficulty in walking, not elsewhere classified: Secondary | ICD-10-CM | POA: Diagnosis not present

## 2021-02-11 DIAGNOSIS — Z4789 Encounter for other orthopedic aftercare: Secondary | ICD-10-CM | POA: Diagnosis not present

## 2021-02-14 DIAGNOSIS — R262 Difficulty in walking, not elsewhere classified: Secondary | ICD-10-CM | POA: Diagnosis not present

## 2021-02-14 DIAGNOSIS — M25562 Pain in left knee: Secondary | ICD-10-CM | POA: Diagnosis not present

## 2021-02-14 DIAGNOSIS — Z4789 Encounter for other orthopedic aftercare: Secondary | ICD-10-CM | POA: Diagnosis not present

## 2021-03-17 ENCOUNTER — Other Ambulatory Visit: Payer: BC Managed Care – PPO

## 2021-12-24 ENCOUNTER — Other Ambulatory Visit: Payer: Self-pay | Admitting: Obstetrics and Gynecology

## 2021-12-24 DIAGNOSIS — R928 Other abnormal and inconclusive findings on diagnostic imaging of breast: Secondary | ICD-10-CM

## 2021-12-24 IMAGING — MR MR KNEE*L* W/O CM
4 of 7 series · 21 of 40 positions shown · non-contrast
Comparison: X-ray 10/16/2020

CLINICAL DATA: Left knee pain after skiing injury 4 days ago

EXAM:
MRI OF THE LEFT KNEE WITHOUT CONTRAST
TECHNIQUE: Multiplanar, multisequence MR imaging of the knee was performed. No
intravenous contrast was administered.

[Series 4: T2 fat-sat · coronal · 4.0mm · 0.59mm/px · 6 of 23 slices shown (1 of 2)]
[im 1/23]
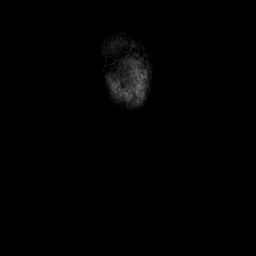
[im 5/23]
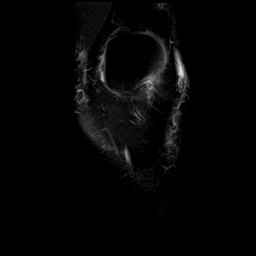
[im 9/23]
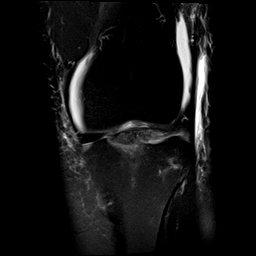
[im 14/23]
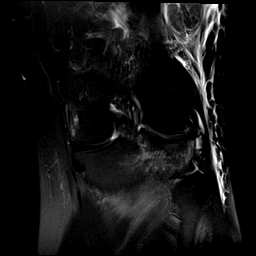
[im 18/23]
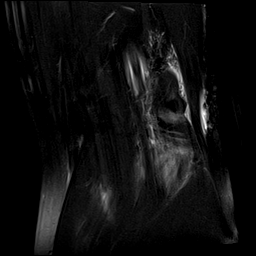
[im 23/23]
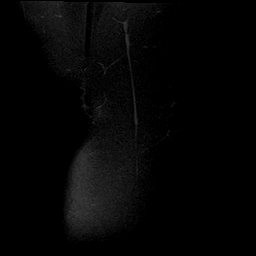

[Series 5: T1 · coronal · 4.0mm · 0.29mm/px · 4 of 23 slices shown]
[im 1/23]
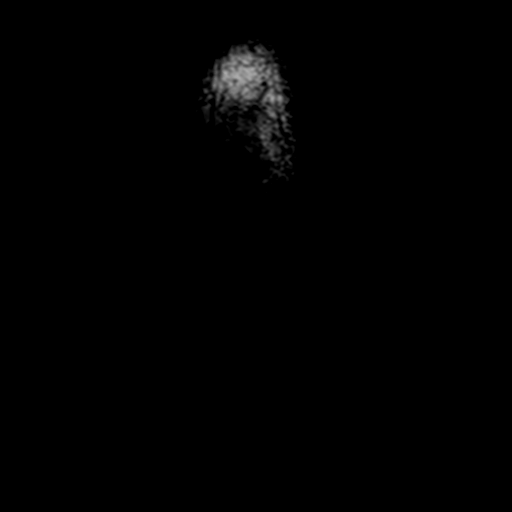
[im 5/23]
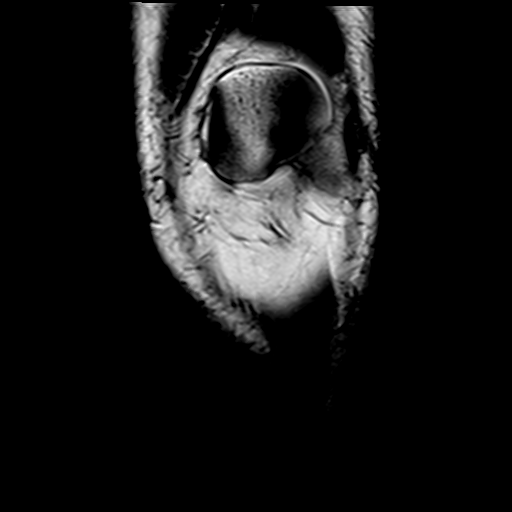
[im 14/23]
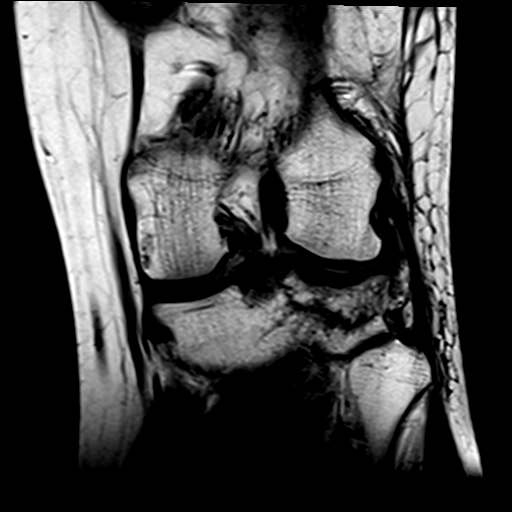
[im 23/23]
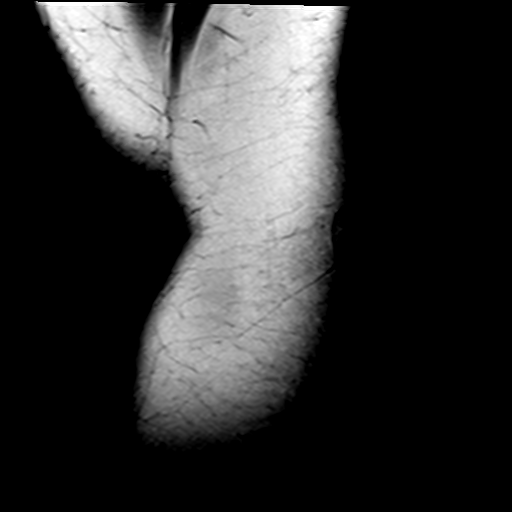

[Series 7: PD fat-sat · sagittal · 3.0mm · 0.29mm/px · 7 of 27 slices shown]
[im 1/27]
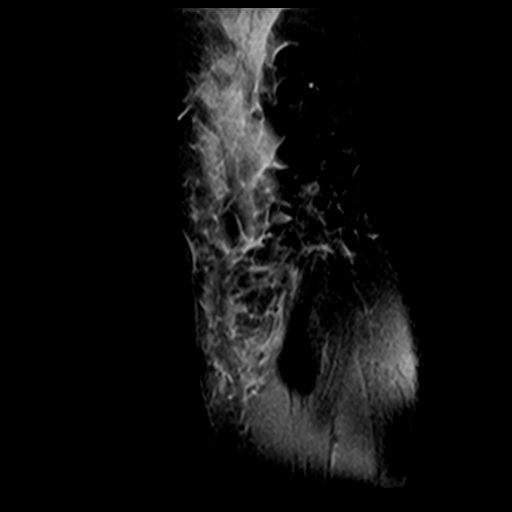
[im 5/27]
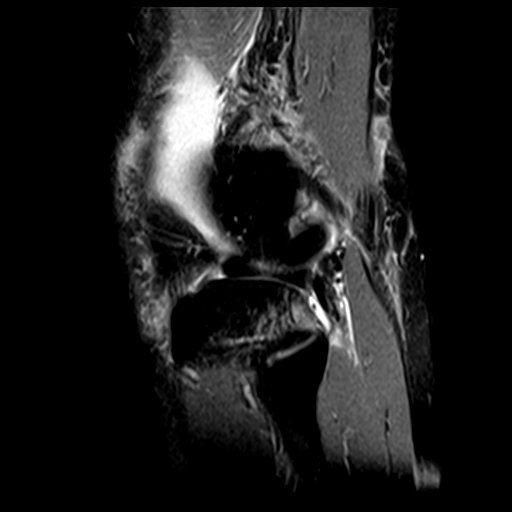
[im 9/27]
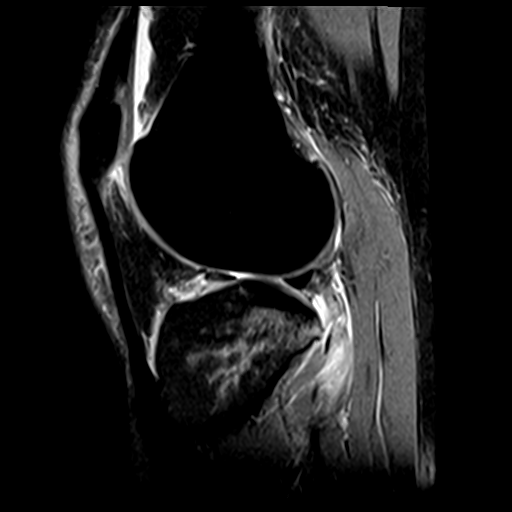
[im 14/27]
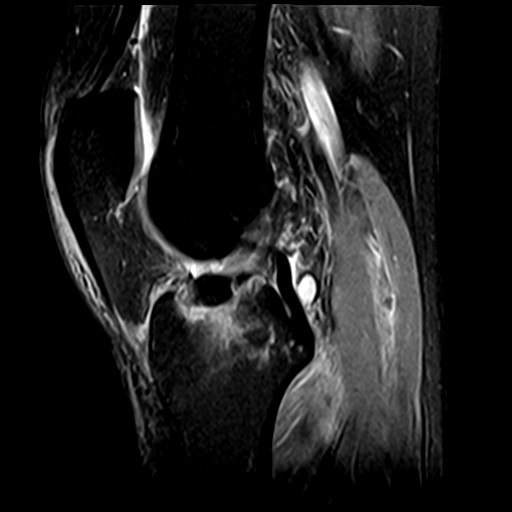
[im 18/27]
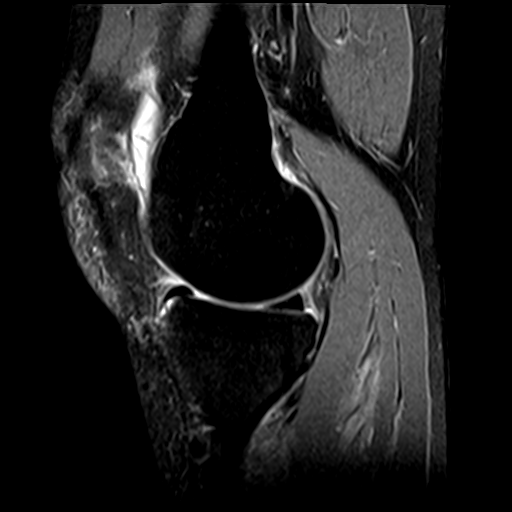
[im 22/27]
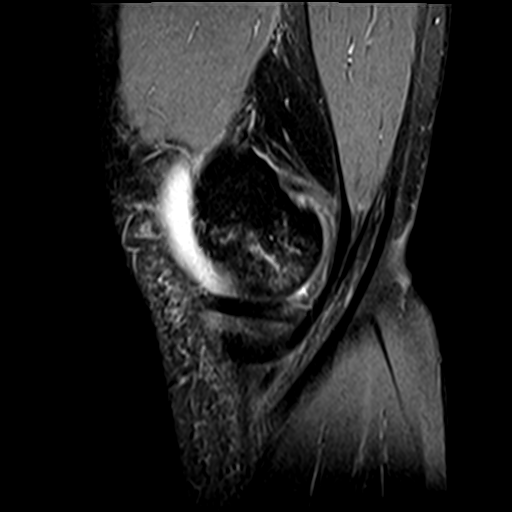
[im 27/27]
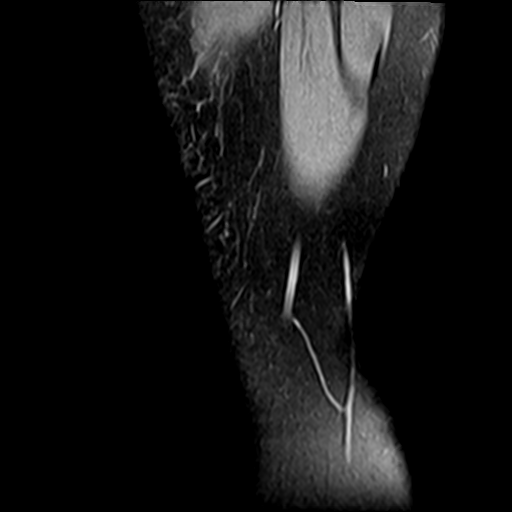

[Series 8: T2 fat-sat · sagittal · 3.0mm · 0.29mm/px · 4 of 14 slices shown (2 of 2)]
[im 1/14]
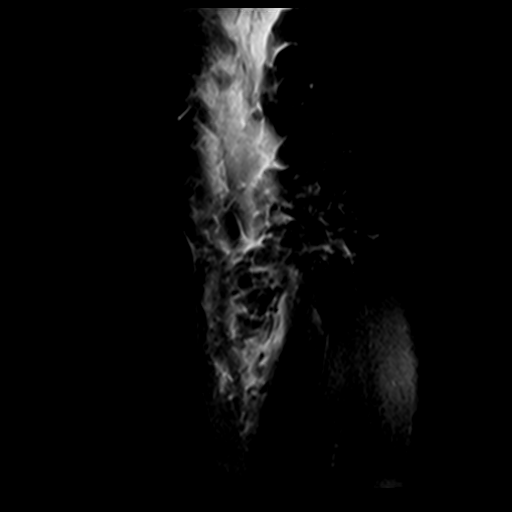
[im 5/14]
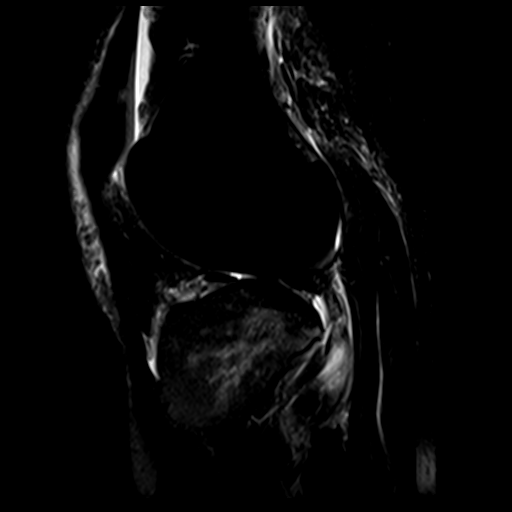
[im 9/14]
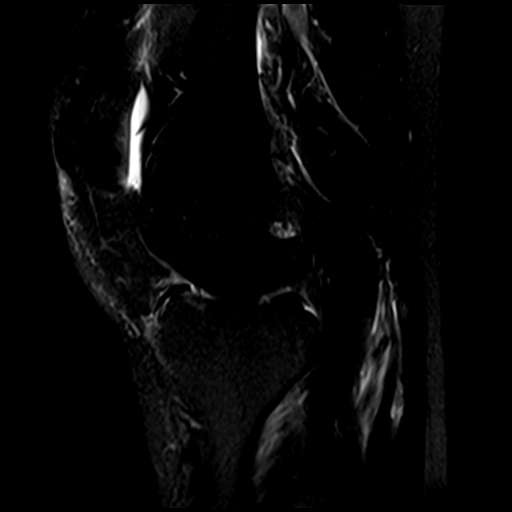
[im 14/14]
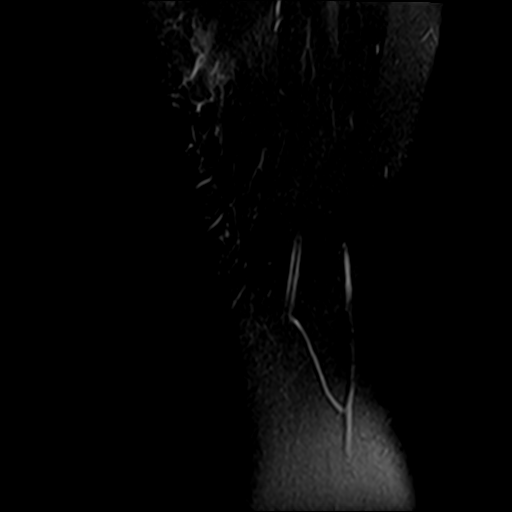

[21 of 40 positions shown; findings below may reference images not displayed]

FINDINGS: MENISCI

Medial meniscus: Mild intermediate intrasubstance signal within the
posterior horn and body of the medial meniscus without tear.
Findings likely reflecting a meniscal contusion or mild
intrasubstance degeneration.

Lateral meniscus: Anterior horn of the lateral meniscus is intact
inserting onto avulsed bone fragment from the tibial eminence
(series 6, image 17). Posterior horn and body of the lateral
meniscus are intact without tear.

LIGAMENTS

Cruciates: The anterior cruciate ligament is intact inserting onto a
avulsed bony fragment of the tibial eminence (series 7, image 15).
There is increased T2 intrasubstance signal within the anterior
cruciate ligament suggesting sprain. No ACL tear. PCL intact. 9 x 5
mm cyst posterior to the PCL, which may reflect a ganglion cyst.

Collaterals: Intact MCL. Low-grade partial tear of the femoral
attachment of the fibular collateral ligament without complete
disruption. Attachments of the IT band and biceps femoris are
intact.

CARTILAGE

Patellofemoral:  No chondral defect.

Medial: Fracture fragment of the tibial eminence involves the
lateral aspect of the tibial plateau articular cartilage (series 4,
images 13-15). Medial femoral condyle articular cartilage intact.

Lateral: Osteochondral impaction fracture at the posterior aspect of
the lateral tibial plateau with overlying full-thickness cartilage
defect (series 8, image 12. Articular cartilage of the lateral
femoral condyle is intact.

Joint: Moderate-sized knee joint effusion. Edematous appearance of
Hoffa's fat.

Popliteal Fossa: High-grade popliteus muscle strain with areas of
fluid density suggesting partial muscle tear. The popliteus tendon
appears intact without tear. No Baker's cyst.

Extensor Mechanism:  Intact quadriceps tendon and patellar tendon.

Bones: Bony avulsion of the tibial attachment of the ACL. The bone
fragment measures 2.1 x 0.9 x 1.4 cm (series 5, image 15). Fragment
is proximally displaced by approximately 0.5 cm. Acute nondisplaced
lateral tibial plateau fracture paralleling the articular surface
without articular surface depression (series 5, images 9-11).
Patella, distal femur, and proximal fibula are intact without
fracture. Subtle contusion at the periphery of the medial femoral
condyle.

Other: Prominent periarticular soft tissue edema. There is
subcutaneous edema most pronounced at the anterior aspect of the
knee.
IMPRESSION: 1. Bony avulsion of the tibial attachment of the anterior cruciate
ligament. The ACL remains intact inserting onto a avulsed 2.1 cm
bony fragment which is displaced proximally by 5 mm.
2. Acute nondisplaced fracture of the lateral tibial plateau
paralleling the articular surface without articular surface
depression.
3. Grade 2 sprain fibular collateral ligament.
4. Moderate-sized knee joint effusion.
5. High-grade popliteus muscle strain with partial tear.
6. The anterior horn of the lateral meniscus inserts onto the
avulsed tibial fracture fragment. Lateral meniscus otherwise intact.
7. Medial meniscal contusion without tear.

These results will be called to the ordering clinician or
representative by the Radiologist Assistant, and communication
documented in the PACS or [REDACTED].

## 2021-12-29 ENCOUNTER — Other Ambulatory Visit: Payer: Self-pay | Admitting: Obstetrics and Gynecology

## 2021-12-29 ENCOUNTER — Ambulatory Visit
Admission: RE | Admit: 2021-12-29 | Discharge: 2021-12-29 | Disposition: A | Discharge status: Home / Self Care | Payer: Self-pay | Source: Ambulatory Visit | Attending: Obstetrics and Gynecology | Admitting: Obstetrics and Gynecology

## 2021-12-29 ENCOUNTER — Ambulatory Visit
Admission: RE | Admit: 2021-12-29 | Discharge: 2021-12-29 | Disposition: A | Discharge status: Home / Self Care | Payer: No Typology Code available for payment source | Source: Ambulatory Visit | Attending: Obstetrics and Gynecology | Admitting: Obstetrics and Gynecology

## 2021-12-29 DIAGNOSIS — N6310 Unspecified lump in the right breast, unspecified quadrant: Secondary | ICD-10-CM

## 2021-12-29 DIAGNOSIS — R928 Other abnormal and inconclusive findings on diagnostic imaging of breast: Secondary | ICD-10-CM

## 2021-12-30 ENCOUNTER — Ambulatory Visit
Admission: RE | Admit: 2021-12-30 | Discharge: 2021-12-30 | Disposition: A | Discharge status: Home / Self Care | Payer: No Typology Code available for payment source | Source: Ambulatory Visit | Attending: Obstetrics and Gynecology | Admitting: Obstetrics and Gynecology

## 2021-12-30 DIAGNOSIS — N6310 Unspecified lump in the right breast, unspecified quadrant: Secondary | ICD-10-CM

## 2022-01-09 ENCOUNTER — Other Ambulatory Visit: Payer: No Typology Code available for payment source

## 2022-01-12 ENCOUNTER — Other Ambulatory Visit: Payer: No Typology Code available for payment source

## 2022-01-25 ENCOUNTER — Other Ambulatory Visit: Payer: Self-pay | Admitting: General Surgery

## 2022-01-25 ENCOUNTER — Encounter: Payer: Self-pay | Admitting: General Surgery

## 2022-01-25 DIAGNOSIS — N390 Urinary tract infection, site not specified: Secondary | ICD-10-CM

## 2022-06-29 DIAGNOSIS — Z23 Encounter for immunization: Secondary | ICD-10-CM | POA: Diagnosis not present

## 2022-06-29 DIAGNOSIS — L820 Inflamed seborrheic keratosis: Secondary | ICD-10-CM | POA: Diagnosis not present

## 2022-06-29 DIAGNOSIS — D229 Melanocytic nevi, unspecified: Secondary | ICD-10-CM | POA: Diagnosis not present

## 2022-06-29 DIAGNOSIS — Z85828 Personal history of other malignant neoplasm of skin: Secondary | ICD-10-CM | POA: Diagnosis not present

## 2022-08-11 DIAGNOSIS — Z0000 Encounter for general adult medical examination without abnormal findings: Secondary | ICD-10-CM | POA: Diagnosis not present

## 2022-08-25 DIAGNOSIS — Z0000 Encounter for general adult medical examination without abnormal findings: Secondary | ICD-10-CM | POA: Diagnosis not present

## 2022-08-25 DIAGNOSIS — Z1331 Encounter for screening for depression: Secondary | ICD-10-CM | POA: Diagnosis not present

## 2022-08-25 DIAGNOSIS — F419 Anxiety disorder, unspecified: Secondary | ICD-10-CM | POA: Diagnosis not present

## 2022-08-25 DIAGNOSIS — Z1339 Encounter for screening examination for other mental health and behavioral disorders: Secondary | ICD-10-CM | POA: Diagnosis not present

## 2022-10-30 DIAGNOSIS — E785 Hyperlipidemia, unspecified: Secondary | ICD-10-CM | POA: Diagnosis not present

## 2022-10-30 DIAGNOSIS — E559 Vitamin D deficiency, unspecified: Secondary | ICD-10-CM | POA: Diagnosis not present

## 2022-10-30 DIAGNOSIS — Z01818 Encounter for other preprocedural examination: Secondary | ICD-10-CM | POA: Diagnosis not present

## 2022-10-30 DIAGNOSIS — H04123 Dry eye syndrome of bilateral lacrimal glands: Secondary | ICD-10-CM | POA: Diagnosis not present

## 2022-10-30 DIAGNOSIS — H5213 Myopia, bilateral: Secondary | ICD-10-CM | POA: Diagnosis not present

## 2022-11-20 DIAGNOSIS — Z0000 Encounter for general adult medical examination without abnormal findings: Secondary | ICD-10-CM | POA: Diagnosis not present

## 2023-05-28 DIAGNOSIS — Z6826 Body mass index (BMI) 26.0-26.9, adult: Secondary | ICD-10-CM | POA: Diagnosis not present

## 2023-05-28 DIAGNOSIS — Z1231 Encounter for screening mammogram for malignant neoplasm of breast: Secondary | ICD-10-CM | POA: Diagnosis not present

## 2023-05-28 DIAGNOSIS — Z124 Encounter for screening for malignant neoplasm of cervix: Secondary | ICD-10-CM | POA: Diagnosis not present

## 2023-05-28 DIAGNOSIS — Z01419 Encounter for gynecological examination (general) (routine) without abnormal findings: Secondary | ICD-10-CM | POA: Diagnosis not present

## 2023-07-05 DIAGNOSIS — Z85828 Personal history of other malignant neoplasm of skin: Secondary | ICD-10-CM | POA: Diagnosis not present

## 2023-07-05 DIAGNOSIS — D229 Melanocytic nevi, unspecified: Secondary | ICD-10-CM | POA: Diagnosis not present

## 2023-07-05 DIAGNOSIS — L821 Other seborrheic keratosis: Secondary | ICD-10-CM | POA: Diagnosis not present

## 2023-08-24 DIAGNOSIS — E559 Vitamin D deficiency, unspecified: Secondary | ICD-10-CM | POA: Diagnosis not present

## 2023-08-24 DIAGNOSIS — E785 Hyperlipidemia, unspecified: Secondary | ICD-10-CM | POA: Diagnosis not present

## 2023-09-07 DIAGNOSIS — Z1331 Encounter for screening for depression: Secondary | ICD-10-CM | POA: Diagnosis not present

## 2023-09-07 DIAGNOSIS — Z1339 Encounter for screening examination for other mental health and behavioral disorders: Secondary | ICD-10-CM | POA: Diagnosis not present

## 2023-09-07 DIAGNOSIS — Z0000 Encounter for general adult medical examination without abnormal findings: Secondary | ICD-10-CM | POA: Diagnosis not present

## 2024-02-03 DIAGNOSIS — H5213 Myopia, bilateral: Secondary | ICD-10-CM | POA: Diagnosis not present

## 2024-02-03 DIAGNOSIS — H00015 Hordeolum externum left lower eyelid: Secondary | ICD-10-CM | POA: Diagnosis not present

## 2024-05-08 DIAGNOSIS — B078 Other viral warts: Secondary | ICD-10-CM | POA: Diagnosis not present

## 2024-07-10 DIAGNOSIS — Z85828 Personal history of other malignant neoplasm of skin: Secondary | ICD-10-CM | POA: Diagnosis not present

## 2024-07-10 DIAGNOSIS — B078 Other viral warts: Secondary | ICD-10-CM | POA: Diagnosis not present

## 2024-07-10 DIAGNOSIS — L821 Other seborrheic keratosis: Secondary | ICD-10-CM | POA: Diagnosis not present

## 2024-07-10 DIAGNOSIS — D229 Melanocytic nevi, unspecified: Secondary | ICD-10-CM | POA: Diagnosis not present

## 2024-07-28 DIAGNOSIS — Z6825 Body mass index (BMI) 25.0-25.9, adult: Secondary | ICD-10-CM | POA: Diagnosis not present

## 2024-07-28 DIAGNOSIS — R87810 Cervical high risk human papillomavirus (HPV) DNA test positive: Secondary | ICD-10-CM | POA: Diagnosis not present

## 2024-07-28 DIAGNOSIS — Z1151 Encounter for screening for human papillomavirus (HPV): Secondary | ICD-10-CM | POA: Diagnosis not present

## 2024-07-28 DIAGNOSIS — Z01419 Encounter for gynecological examination (general) (routine) without abnormal findings: Secondary | ICD-10-CM | POA: Diagnosis not present

## 2024-07-28 DIAGNOSIS — Z124 Encounter for screening for malignant neoplasm of cervix: Secondary | ICD-10-CM | POA: Diagnosis not present

## 2024-09-05 ENCOUNTER — Other Ambulatory Visit: Payer: Self-pay

## 2024-09-05 ENCOUNTER — Emergency Department (HOSPITAL_COMMUNITY)
Admission: EM | Admit: 2024-09-05 | Discharge: 2024-09-05 | Disposition: A | Discharge status: Home / Self Care | Attending: Emergency Medicine | Admitting: Emergency Medicine

## 2024-09-05 ENCOUNTER — Emergency Department (HOSPITAL_COMMUNITY)

## 2024-09-05 DIAGNOSIS — W19XXXA Unspecified fall, initial encounter: Secondary | ICD-10-CM

## 2024-09-05 DIAGNOSIS — S0990XA Unspecified injury of head, initial encounter: Secondary | ICD-10-CM

## 2024-09-05 DIAGNOSIS — S060X0A Concussion without loss of consciousness, initial encounter: Secondary | ICD-10-CM

## 2024-09-05 MED ORDER — ONDANSETRON HCL 4 MG/2ML IJ SOLN
4.0000 mg | Freq: Once | INTRAMUSCULAR | Status: AC
  Administered 2024-09-05: 4 mg via INTRAVENOUS
  Filled 2024-09-05: qty 2

## 2024-09-05 MED ORDER — FENTANYL CITRATE (PF) 50 MCG/ML IJ SOSY
25.0000 ug | PREFILLED_SYRINGE | Freq: Once | INTRAMUSCULAR | Status: AC
  Administered 2024-09-05: 25 ug via INTRAVENOUS
  Filled 2024-09-05: qty 1

## 2024-09-05 MED ORDER — SODIUM CHLORIDE 0.9 % IV BOLUS
500.0000 mL | Freq: Once | INTRAVENOUS | Status: AC
  Administered 2024-09-05: 500 mL via INTRAVENOUS

## 2024-09-05 MED ORDER — KETOROLAC TROMETHAMINE 15 MG/ML IJ SOLN
15.0000 mg | Freq: Once | INTRAMUSCULAR | Status: AC
  Administered 2024-09-05: 15 mg via INTRAVENOUS
  Filled 2024-09-05: qty 1

## 2024-09-05 NOTE — ED Notes (Signed)
 Per MD Garrick, patient to wake up more prior to dc.

## 2024-09-05 NOTE — ED Notes (Signed)
Got patient a pillow

## 2024-09-05 NOTE — ED Notes (Signed)
Patient transported to CT with TRN Nira Conn

## 2024-09-05 NOTE — Discharge Instructions (Signed)
 You have been diagnosed with a concussion following your fall with head trauma.  Please monitor your condition carefully and do not hesitate to return here for any concerning changes in your condition.

## 2024-09-05 NOTE — ED Provider Notes (Signed)
 [place] EMERGENCY DEPARTMENT AT [facility] Provider Note   CSN: [id number] Arrival date & time: 09/05/24  1055     Patient presents with: Pamela Gamble is a 43 y.o. female.   HPI Patient presents from home following head trauma.  She was in her usual state of health when she fell while walking dog, on ice. She sustained head trauma, to her occiput, per EMS was confused, though this resolved prior to ED arrival. History obtained by patient, and her husband who is a careers adviser here. Additional history per EMS. Patient denies weakness in any extremity, chest pain, or other complaints.     Prior to Admission medications   Medication Sig Start Date End Date Taking? Authorizing Provider  aspirin EC 81 MG tablet Take 81 mg by mouth daily. Swallow whole.    [provider]  buPROPion (WELLBUTRIN XL) 300 MG 24 hr tablet Take 300 mg by mouth every morning. 08/13/24   [provider]  escitalopram (LEXAPRO) 20 MG tablet Take by mouth daily.    [provider]  meloxicam  (MOBIC ) 15 MG tablet Take 1 tablet (15 mg total) by mouth daily as needed for pain. 11/04/20   Magnant, Charles L, PA-C  rosuvastatin (CRESTOR) 5 MG tablet Take 5 mg by mouth daily. 08/13/24   [provider]    Allergies: Bactrim [sulfamethoxazole-trimethoprim]    Review of Systems  Updated Vital Signs BP (!) 130/94 (BP Location: Right Arm)   Pulse 68   Temp 97.8 F (36.6 C) (Oral)   Resp 16   Ht 1.626 m (5' 4)   Wt 67.6 kg   SpO2 100%   BMI 25.58 kg/m   Physical Exam Vitals and nursing note reviewed.  Constitutional:      General: She is not in acute distress.    Appearance: She is well-developed.  HENT:     Head: Normocephalic and atraumatic.   Eyes:     Conjunctiva/sclera: Conjunctivae normal.  Neck:   Cardiovascular:     Rate and Rhythm: Normal rate and regular rhythm.  Pulmonary:     Effort: Pulmonary effort is normal. No respiratory  distress.     Breath sounds: No stridor.  Abdominal:     General: There is no distension.  Skin:    General: Skin is warm and dry.  Neurological:     General: No focal deficit present.     Mental Status: She is alert and oriented to person, place, and time.     Cranial Nerves: No cranial nerve deficit.  Psychiatric:        Mood and Affect: Mood normal.     (all labs ordered are listed, but only abnormal results are displayed) Labs Reviewed - No data to display  EKG: None  Radiology: CT Cervical Spine Wo Contrast Result Date: 09/05/2024 EXAM: CT CERVICAL SPINE WITHOUT CONTRAST 09/05/2024 11:31:00 AM TECHNIQUE: CT of the cervical spine was performed without the administration of intravenous contrast. Multiplanar reformatted images are provided for review. Automated exposure control, iterative reconstruction, and/or weight based adjustment of the mA/kV was utilized to reduce the radiation dose to as low as reasonably achievable. COMPARISON: None available. CLINICAL HISTORY: Polytrauma, blunt Polytrauma, blunt FINDINGS: CERVICAL SPINE: BONES AND ALIGNMENT: No acute fracture or traumatic malalignment. DEGENERATIVE CHANGES: No significant degenerative changes. SOFT TISSUES: No prevertebral soft tissue swelling. IMPRESSION: 1. No acute abnormality of the cervical spine. Electronically signed by: Evalene Coho MD 09/05/2024 11:36 AM EST RP Workstation:  [id number]   CT Head Wo Contrast Result Date: 09/05/2024 EXAM: CT HEAD WITHOUT CONTRAST 09/05/2024 11:31:00 AM TECHNIQUE: CT of the head was performed without the administration of intravenous contrast. Automated exposure control, iterative reconstruction, and/or weight based adjustment of the mA/kV was utilized to reduce the radiation dose to as low as reasonably achievable. COMPARISON: None available. CLINICAL HISTORY: Polytrauma, blunt Polytrauma, blunt FINDINGS: BRAIN AND VENTRICLES: No acute hemorrhage. No evidence of acute infarct. No  hydrocephalus. No extra-axial collection. No mass effect or midline shift. ORBITS: No acute abnormality. SINUSES: No acute abnormality. SOFT TISSUES AND SKULL: No acute soft tissue abnormality. No skull fracture. IMPRESSION: 1. No acute intracranial abnormality. Electronically signed by: Evalene Coho MD 09/05/2024 11:35 AM EST RP Workstation: [id number]     Procedures   Medications Ordered in the ED  fentaNYL  (SUBLIMAZE ) injection 25 mcg (25 mcg Intravenous Given 09/05/24 1115)  ondansetron  (ZOFRAN ) injection 4 mg (4 mg Intravenous Given 09/05/24 1115)  sodium chloride  0.9 % bolus 500 mL (500 mLs Intravenous New Bag/Given 09/05/24 1116)                                    Medical Decision Making Previously well adult female presents after sustaining head trauma, slipping on ice. Patient is awake, alert, speaking clearly, is neurologically unremarkable, but with head trauma, confusion, hematoma, concern for intracranial abnormality, versus fracture versus neck injury. Initial neuroexam, vital signs reassuring. Vitals 60 sinus normal pulse ox 100% room air normal.   Amount and/or Complexity of Data Reviewed Independent Historian: spouse and EMS Radiology: ordered and independent interpretation performed. Decision-making details documented in ED Course.  Risk Prescription drug management. Decision regarding hospitalization.   12:26 PM Cervical collar removed, C-spine cleared.  Patient has no new neurocomplaints, remains hemodynamically unremarkable.  Discussed all findings with her, reviewed CT imaging, concern for concussion with head trauma, status post fall.  Findings otherwise reassuring.  Patient will be observed for period, likely appropriate for discharge with outpatient follow-up as needed.     Final diagnoses:  Fall, initial encounter  Injury of head, initial encounter  Concussion without loss of consciousness, initial encounter    ED Discharge Orders     None           Garrick Charleston, MD 09/05/24 1227

## 2024-09-05 NOTE — ED Triage Notes (Signed)
 Patient arrives via [facility] for head injury after mechanical fall where [place]. Patient initially confused about name and birthday. Patient now GCS 15. PERRLA. 2 inch hematoma to back of head under c-collar. Patient endorses neck pain, nausea, and dizziness.   EMS vitals BP 124/72 HR 80 18 RR 100 on room air
